# Patient Record
Sex: Female | Born: 1975 | Race: White | Hispanic: No | Marital: Married | Smoking: Never smoker
Health system: Southern US, Community
[De-identification: ages and names within clinical notes are randomized; demographics above are authoritative.]

## PROBLEM LIST (undated history)

## (undated) DIAGNOSIS — D069 Carcinoma in situ of cervix, unspecified: Secondary | ICD-10-CM

## (undated) DIAGNOSIS — O24419 Gestational diabetes mellitus in pregnancy, unspecified control: Secondary | ICD-10-CM

## (undated) DIAGNOSIS — K219 Gastro-esophageal reflux disease without esophagitis: Secondary | ICD-10-CM

## (undated) DIAGNOSIS — F419 Anxiety disorder, unspecified: Secondary | ICD-10-CM

## (undated) DIAGNOSIS — Z8619 Personal history of other infectious and parasitic diseases: Secondary | ICD-10-CM

## (undated) DIAGNOSIS — O09529 Supervision of elderly multigravida, unspecified trimester: Secondary | ICD-10-CM

## (undated) DIAGNOSIS — E0590 Thyrotoxicosis, unspecified without thyrotoxic crisis or storm: Secondary | ICD-10-CM

## (undated) DIAGNOSIS — R87619 Unspecified abnormal cytological findings in specimens from cervix uteri: Secondary | ICD-10-CM

## (undated) DIAGNOSIS — E039 Hypothyroidism, unspecified: Secondary | ICD-10-CM

## (undated) DIAGNOSIS — J45909 Unspecified asthma, uncomplicated: Secondary | ICD-10-CM

## (undated) DIAGNOSIS — Q51810 Arcuate uterus: Secondary | ICD-10-CM

## (undated) DIAGNOSIS — E0500 Thyrotoxicosis with diffuse goiter without thyrotoxic crisis or storm: Secondary | ICD-10-CM

## (undated) DIAGNOSIS — IMO0002 Reserved for concepts with insufficient information to code with codable children: Secondary | ICD-10-CM

## (undated) DIAGNOSIS — O09299 Supervision of pregnancy with other poor reproductive or obstetric history, unspecified trimester: Secondary | ICD-10-CM

## (undated) DIAGNOSIS — Z8632 Personal history of gestational diabetes: Secondary | ICD-10-CM

## (undated) HISTORY — DX: Personal history of gestational diabetes: Z86.32

## (undated) HISTORY — DX: Supervision of pregnancy with other poor reproductive or obstetric history, unspecified trimester: O09.299

## (undated) HISTORY — DX: Hypothyroidism, unspecified: E03.9

## (undated) HISTORY — DX: Anxiety disorder, unspecified: F41.9

## (undated) HISTORY — DX: Thyrotoxicosis, unspecified without thyrotoxic crisis or storm: E05.90

## (undated) HISTORY — DX: Thyrotoxicosis with diffuse goiter without thyrotoxic crisis or storm: E05.00

## (undated) HISTORY — DX: Gestational diabetes mellitus in pregnancy, unspecified control: O24.419

## (undated) HISTORY — DX: Unspecified asthma, uncomplicated: J45.909

## (undated) HISTORY — DX: Carcinoma in situ of cervix, unspecified: D06.9

## (undated) HISTORY — DX: Arcuate uterus: Q51.810

## (undated) HISTORY — DX: Personal history of other infectious and parasitic diseases: Z86.19

## (undated) HISTORY — PX: WISDOM TOOTH EXTRACTION: SHX21

## (undated) HISTORY — PX: BREAST BIOPSY: SHX20

## (undated) HISTORY — DX: Supervision of elderly multigravida, unspecified trimester: O09.529

---

## 1996-02-17 HISTORY — PX: CERVICAL BIOPSY  W/ LOOP ELECTRODE EXCISION: SUR135

## 1997-01-16 HISTORY — PX: LEEP: SHX91

## 1998-01-06 ENCOUNTER — Other Ambulatory Visit: Admission: RE | Admit: 1998-01-06 | Discharge: 1998-01-06 | Payer: Self-pay | Admitting: Obstetrics and Gynecology

## 2000-01-25 ENCOUNTER — Other Ambulatory Visit: Admission: RE | Admit: 2000-01-25 | Discharge: 2000-01-25 | Payer: Self-pay | Admitting: Obstetrics and Gynecology

## 2001-03-25 ENCOUNTER — Encounter: Admission: RE | Admit: 2001-03-25 | Discharge: 2001-03-25 | Payer: Self-pay | Admitting: Internal Medicine

## 2001-03-25 ENCOUNTER — Encounter: Payer: Self-pay | Admitting: Internal Medicine

## 2001-04-02 ENCOUNTER — Other Ambulatory Visit: Admission: RE | Admit: 2001-04-02 | Discharge: 2001-04-02 | Payer: Self-pay | Admitting: Obstetrics and Gynecology

## 2002-04-08 ENCOUNTER — Other Ambulatory Visit: Admission: RE | Admit: 2002-04-08 | Discharge: 2002-04-08 | Payer: Self-pay | Admitting: Obstetrics and Gynecology

## 2003-11-11 ENCOUNTER — Other Ambulatory Visit: Admission: RE | Admit: 2003-11-11 | Discharge: 2003-11-11 | Payer: Self-pay | Admitting: Obstetrics and Gynecology

## 2004-02-26 ENCOUNTER — Other Ambulatory Visit: Admission: RE | Admit: 2004-02-26 | Discharge: 2004-02-26 | Payer: Self-pay | Admitting: Gynecology

## 2004-03-13 ENCOUNTER — Inpatient Hospital Stay (HOSPITAL_COMMUNITY): Admission: AD | Admit: 2004-03-13 | Discharge: 2004-03-15 | Payer: Self-pay | Admitting: Gynecology

## 2004-05-17 ENCOUNTER — Encounter: Admission: RE | Admit: 2004-05-17 | Discharge: 2004-08-15 | Payer: Self-pay | Admitting: Gynecology

## 2004-09-12 ENCOUNTER — Inpatient Hospital Stay (HOSPITAL_COMMUNITY): Admission: AD | Admit: 2004-09-12 | Discharge: 2004-09-15 | Payer: Self-pay | Admitting: Gynecology

## 2004-09-13 ENCOUNTER — Encounter (INDEPENDENT_AMBULATORY_CARE_PROVIDER_SITE_OTHER): Payer: Self-pay | Admitting: Specialist

## 2004-12-02 ENCOUNTER — Other Ambulatory Visit: Admission: RE | Admit: 2004-12-02 | Discharge: 2004-12-02 | Payer: Self-pay | Admitting: Gynecology

## 2005-03-28 ENCOUNTER — Emergency Department (HOSPITAL_COMMUNITY): Admission: EM | Admit: 2005-03-28 | Discharge: 2005-03-28 | Payer: Self-pay | Admitting: *Deleted

## 2005-04-09 ENCOUNTER — Encounter: Admission: RE | Admit: 2005-04-09 | Discharge: 2005-04-09 | Payer: Self-pay | Admitting: Otolaryngology

## 2005-05-01 ENCOUNTER — Ambulatory Visit (HOSPITAL_COMMUNITY): Admission: RE | Admit: 2005-05-01 | Discharge: 2005-05-01 | Payer: Self-pay | Admitting: Gynecology

## 2005-07-26 ENCOUNTER — Other Ambulatory Visit: Admission: RE | Admit: 2005-07-26 | Discharge: 2005-07-26 | Payer: Self-pay | Admitting: Gynecology

## 2006-02-12 ENCOUNTER — Inpatient Hospital Stay (HOSPITAL_COMMUNITY): Admission: AD | Admit: 2006-02-12 | Discharge: 2006-02-14 | Payer: Self-pay | Admitting: Gynecology

## 2006-03-13 ENCOUNTER — Other Ambulatory Visit: Admission: RE | Admit: 2006-03-13 | Discharge: 2006-03-13 | Payer: Self-pay | Admitting: Gynecology

## 2007-04-12 IMAGING — CR DG CHEST 2V
2 series · 2 of 2 positions shown · non-contrast
Comparison: none

CLINICAL DATA: Swollen lymph nodes in the posterior cervical region with no history of smoking. 
 CHEST ? 2 VIEW:

[view not recorded (1 of 2)]
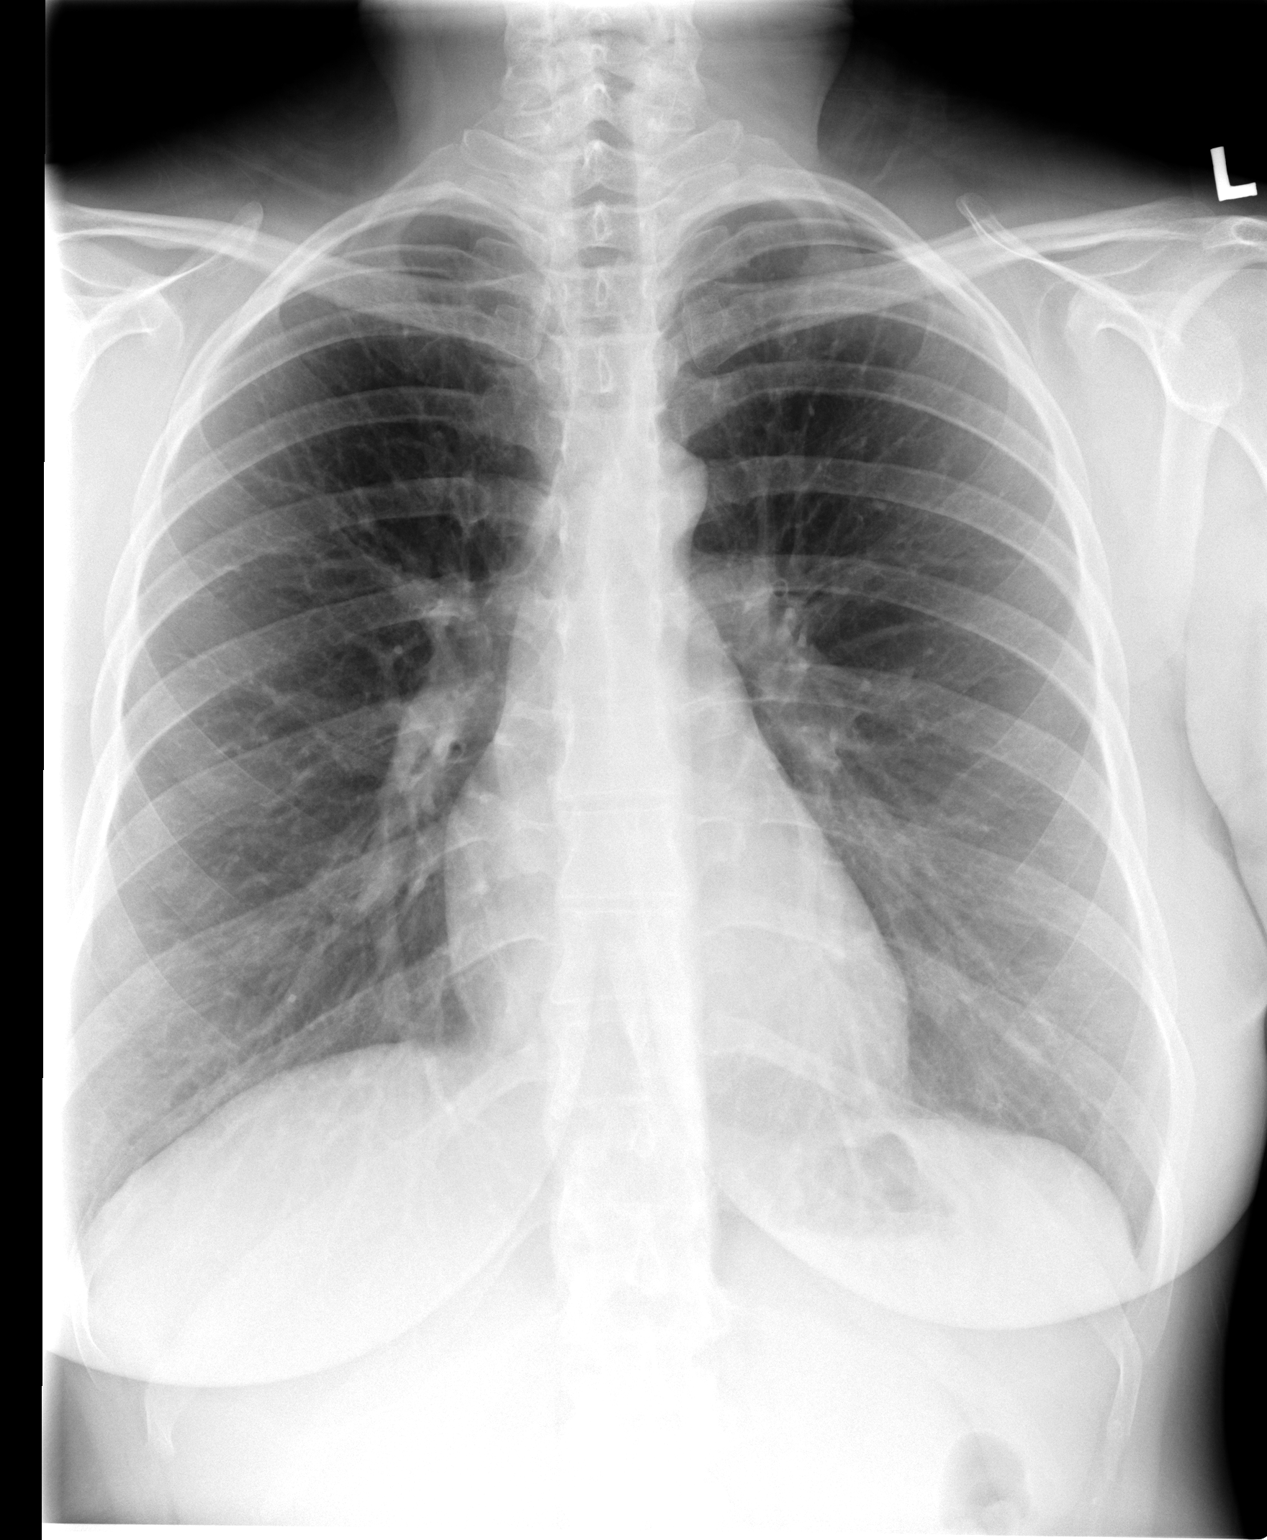

[view not recorded (2 of 2)]
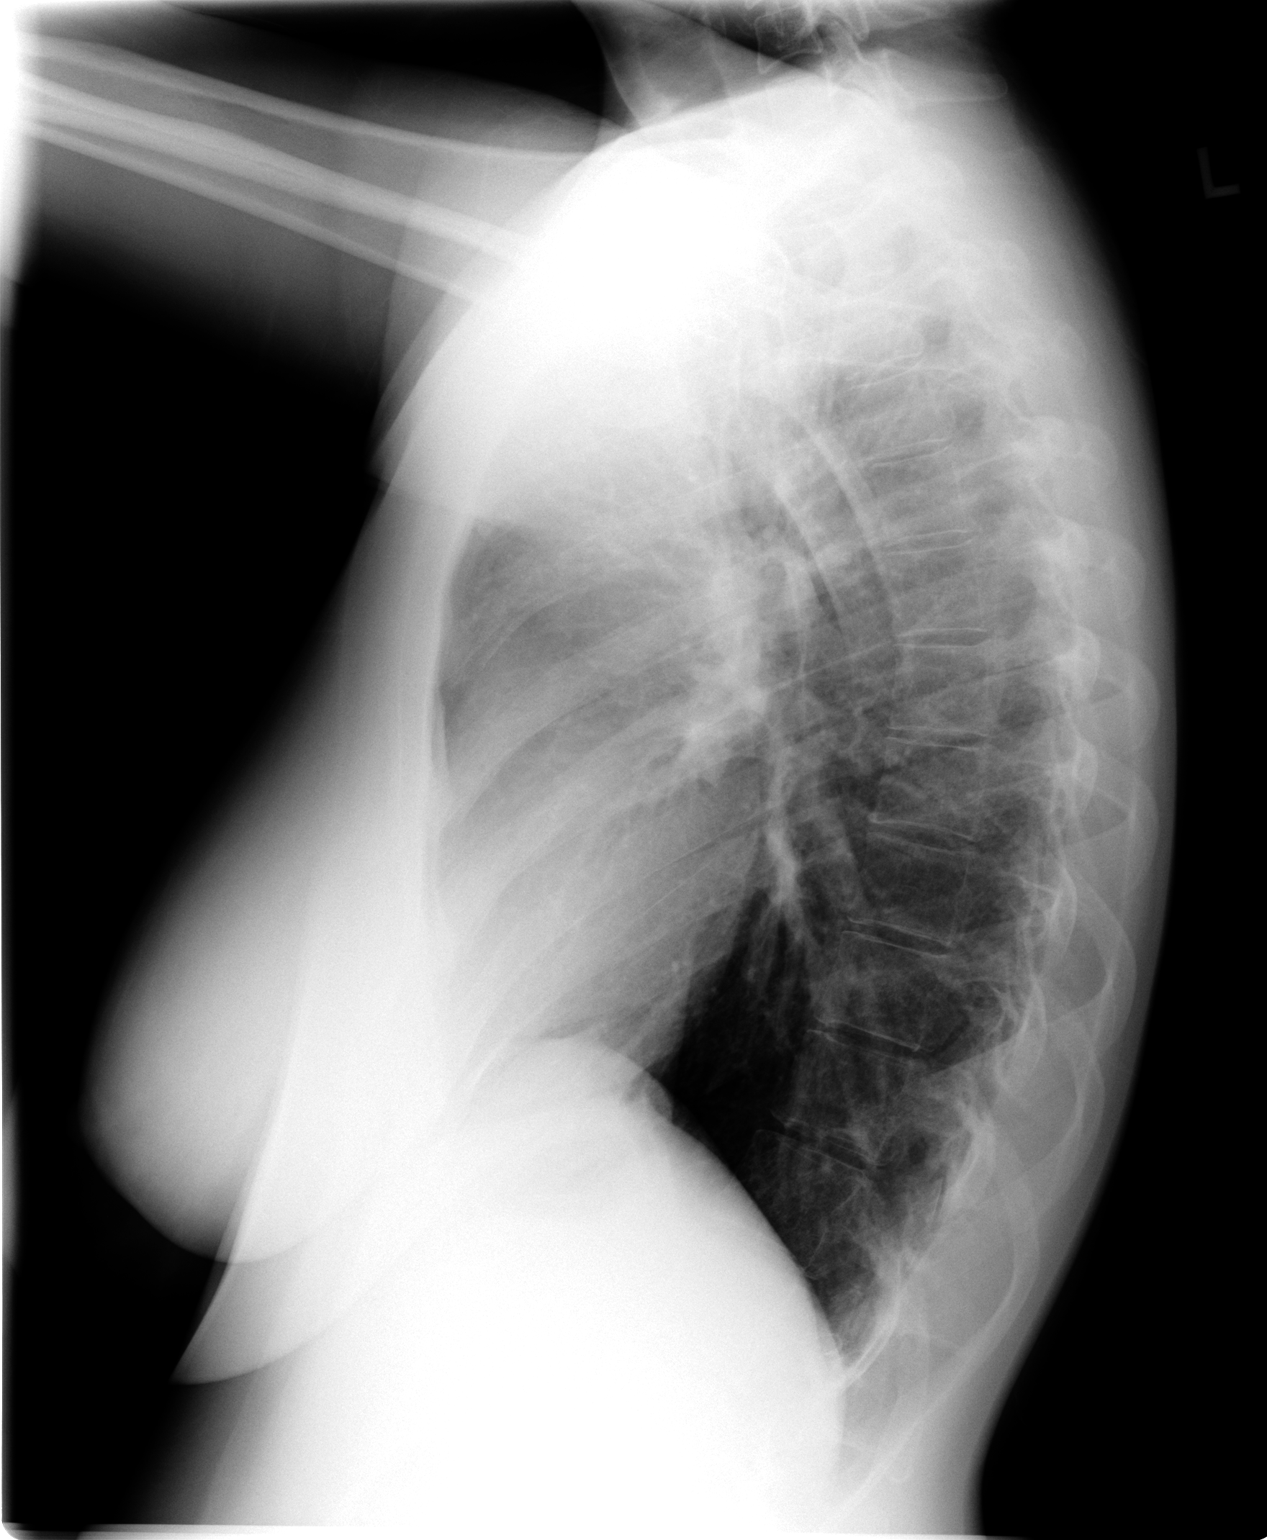

[2 of 2 positions shown; findings below may reference images not displayed]

FINDINGS: Heart and mediastinal contours are within normal limits.  The lung fields are clear with no evidence for focal infiltrate or congestive failure.  Bony structures appear intact.
IMPRESSION: No acute disease.

## 2007-04-12 IMAGING — MG MM DIGITAL SCREENING BILAT
4 series · 4 of 4 positions shown · non-contrast
Comparison: none

Bilateral CC and MLO view(s) were taken.

SCREENING MAMMOGRAM:
There is a  dense fibroglandular pattern.  No masses or malignant type calcifications are 
identified.

[R CC]
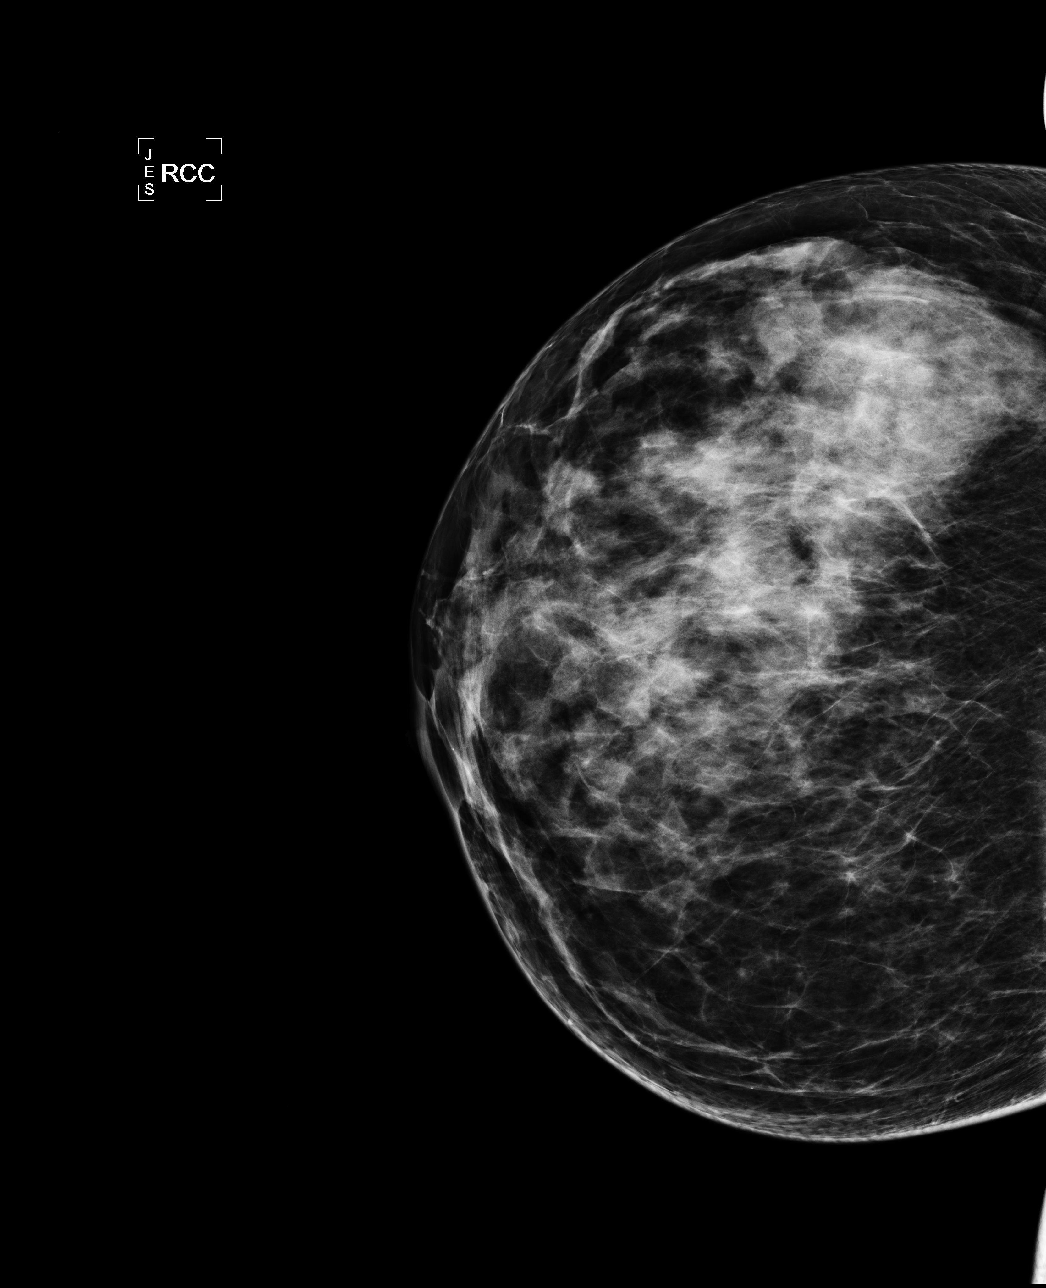

[R MLO]
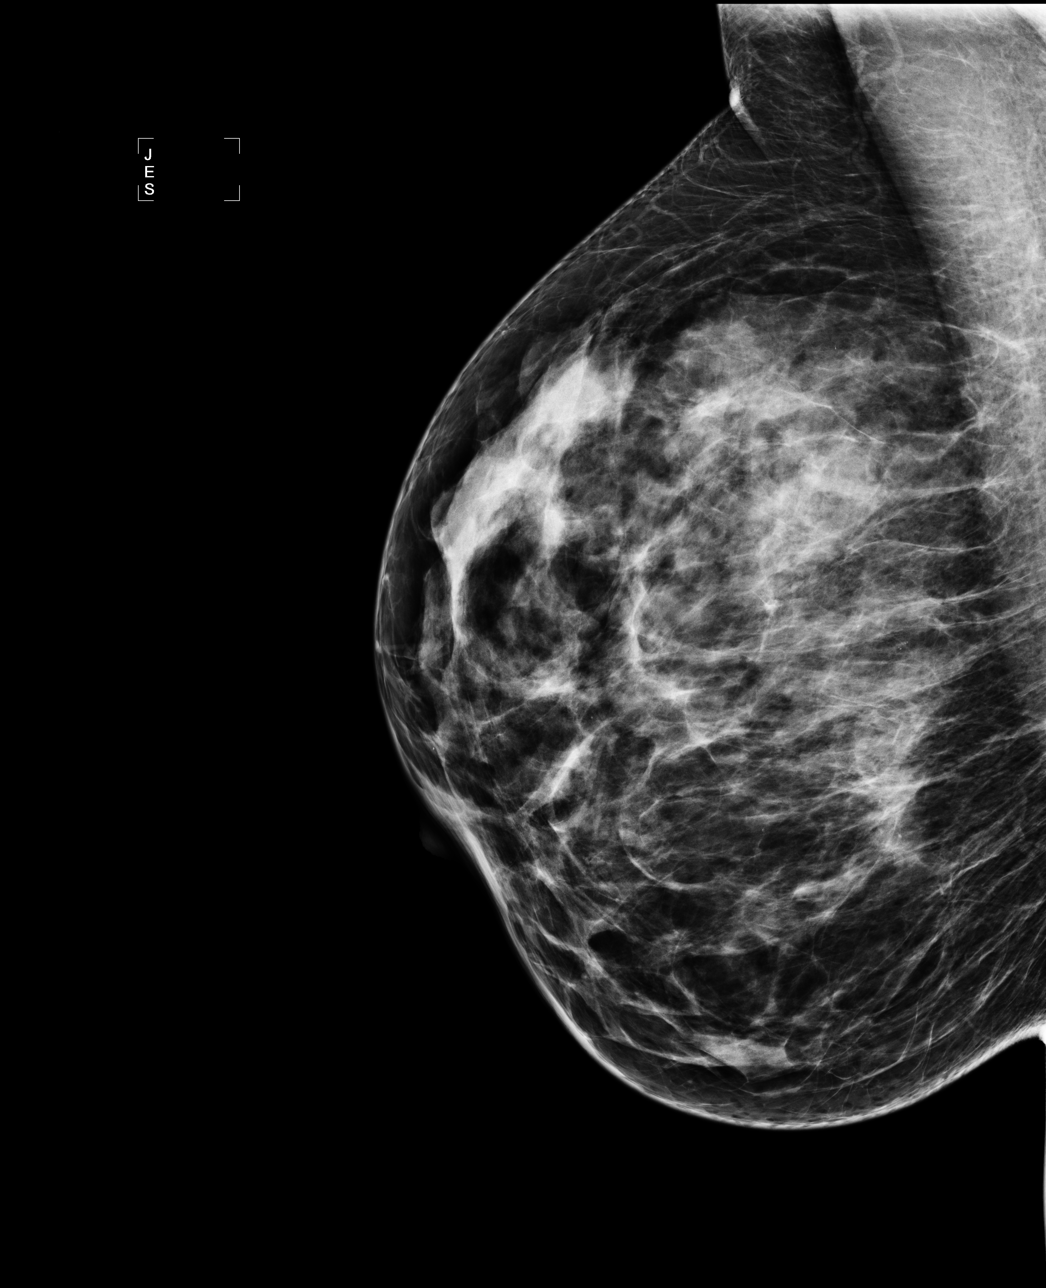

[L CC]
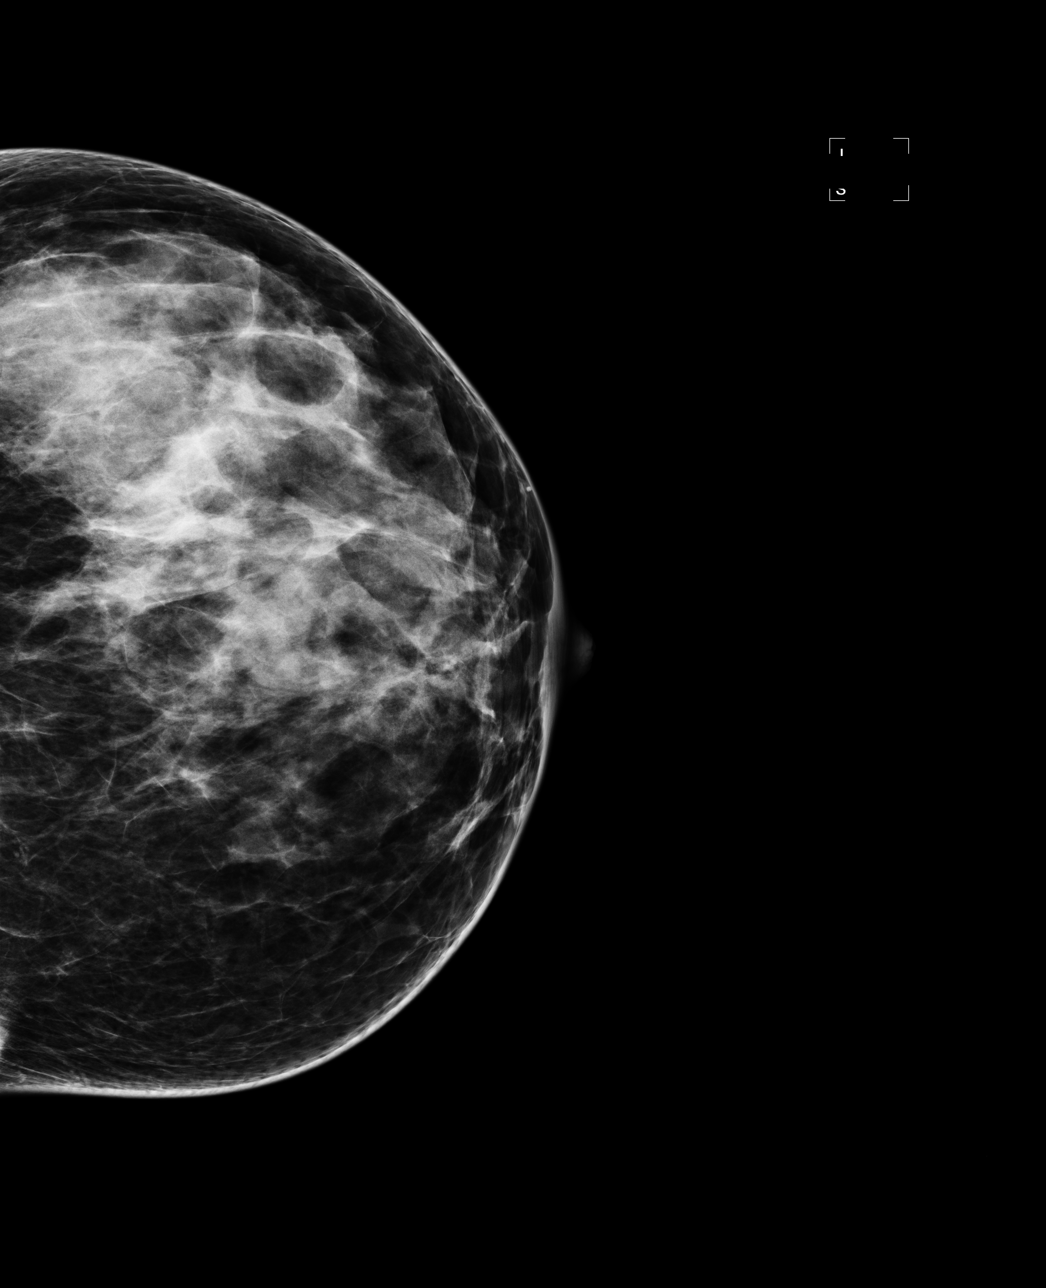

[L MLO]
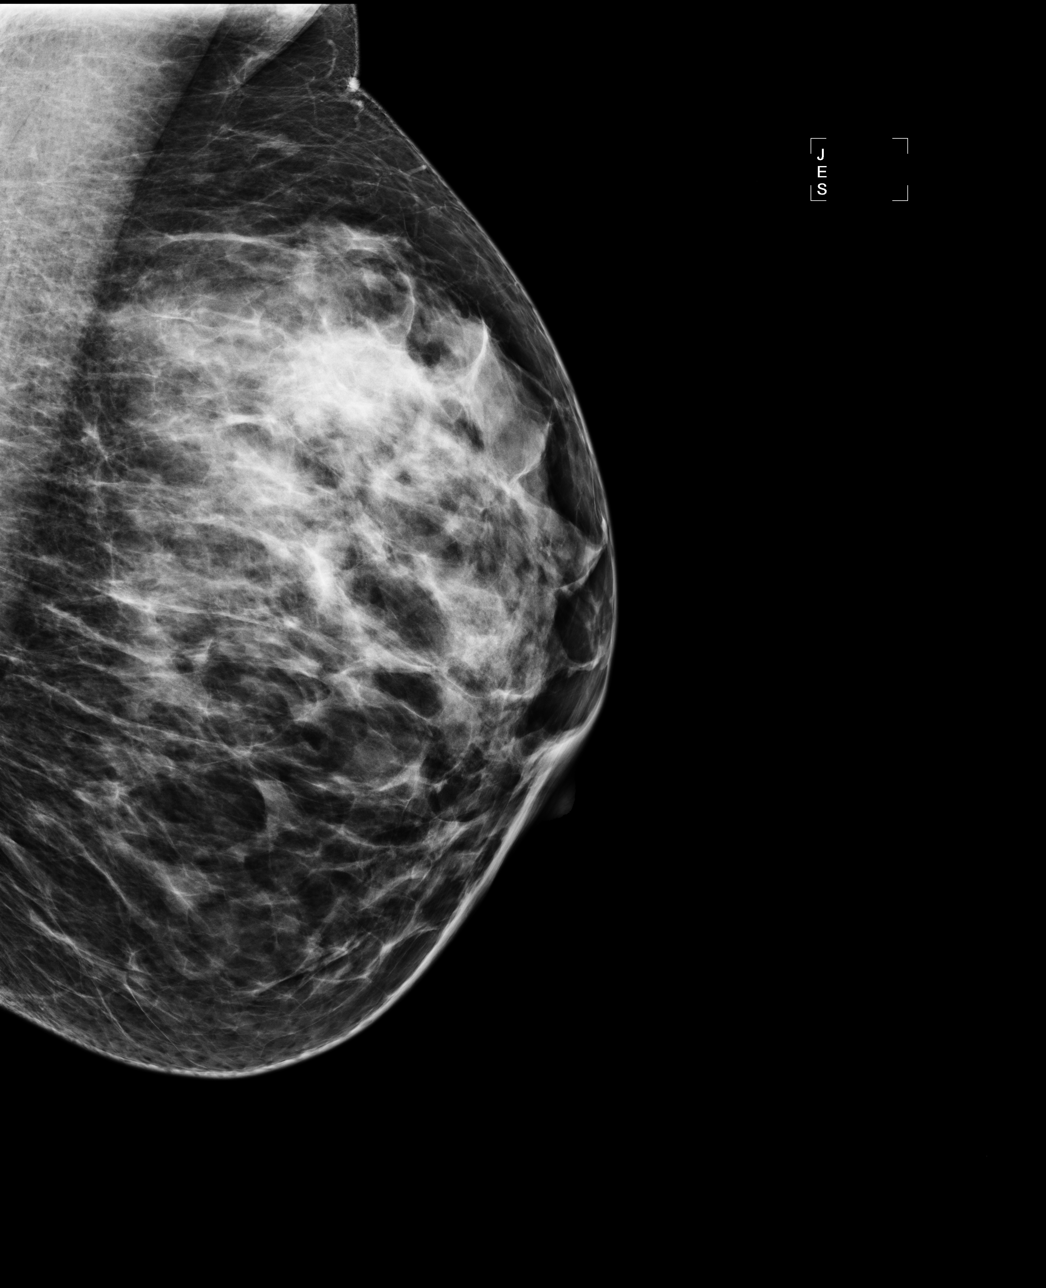

[4 of 4 positions shown; findings below may reference images not displayed]

IMPRESSION: No specific mammographic evidence of malignancy.  Next screening mammogram is recommended in one 
year.

ASSESSMENT: Negative - BI-RADS 1

Screening mammogram in 1 year.

## 2007-06-04 ENCOUNTER — Other Ambulatory Visit: Admission: RE | Admit: 2007-06-04 | Discharge: 2007-06-04 | Payer: Self-pay | Admitting: Gynecology

## 2007-07-15 ENCOUNTER — Encounter: Admission: RE | Admit: 2007-07-15 | Discharge: 2007-07-15 | Payer: Self-pay | Admitting: Family Medicine

## 2008-07-03 ENCOUNTER — Encounter: Payer: Self-pay | Admitting: Gynecology

## 2008-07-03 ENCOUNTER — Ambulatory Visit: Payer: Self-pay | Admitting: Gynecology

## 2008-07-03 ENCOUNTER — Other Ambulatory Visit: Admission: RE | Admit: 2008-07-03 | Discharge: 2008-07-03 | Payer: Self-pay | Admitting: Gynecology

## 2009-10-19 ENCOUNTER — Ambulatory Visit: Payer: Self-pay | Admitting: Gynecology

## 2009-10-26 ENCOUNTER — Ambulatory Visit: Payer: Self-pay | Admitting: Gynecology

## 2009-12-27 ENCOUNTER — Ambulatory Visit: Payer: Self-pay | Admitting: Gynecology

## 2009-12-27 ENCOUNTER — Other Ambulatory Visit
Admission: RE | Admit: 2009-12-27 | Discharge: 2009-12-27 | Payer: Self-pay | Source: Home / Self Care | Admitting: Gynecology

## 2010-01-03 ENCOUNTER — Ambulatory Visit: Payer: Self-pay | Admitting: Gynecology

## 2010-01-07 ENCOUNTER — Ambulatory Visit: Payer: Self-pay | Admitting: Gynecology

## 2010-01-11 ENCOUNTER — Ambulatory Visit
Admission: RE | Admit: 2010-01-11 | Discharge: 2010-01-11 | Payer: Self-pay | Source: Home / Self Care | Attending: Gynecology | Admitting: Gynecology

## 2010-01-26 ENCOUNTER — Ambulatory Visit
Admission: RE | Admit: 2010-01-26 | Discharge: 2010-01-26 | Payer: Self-pay | Source: Home / Self Care | Attending: Gynecology | Admitting: Gynecology

## 2010-01-31 ENCOUNTER — Ambulatory Visit: Admit: 2010-01-31 | Payer: Self-pay | Admitting: Gynecology

## 2010-03-24 ENCOUNTER — Other Ambulatory Visit: Payer: BC Managed Care – PPO

## 2010-03-24 ENCOUNTER — Ambulatory Visit (INDEPENDENT_AMBULATORY_CARE_PROVIDER_SITE_OTHER): Payer: BC Managed Care – PPO | Admitting: Gynecology

## 2010-03-24 DIAGNOSIS — N949 Unspecified condition associated with female genital organs and menstrual cycle: Secondary | ICD-10-CM

## 2010-03-24 DIAGNOSIS — E039 Hypothyroidism, unspecified: Secondary | ICD-10-CM

## 2010-06-03 NOTE — H&P (Signed)
NAME:  Tammy Walton, Tammy Walton            ACCOUNT NO.:  [id number]   MEDICAL RECORD NO.:  [id number]          PATIENT TYPE:  INP   LOCATION:                                FACILITY:  [facility]   PHYSICIAN:  Juan H. Lily Peer, M.D.     DATE OF BIRTH:   DATE OF ADMISSION:  02/16/2006  DATE OF DISCHARGE:                              HISTORY & PHYSICAL   The patient is scheduled to be induced in the hospital at [facility] on Friday, February 16, 2006.   CHIEF COMPLAINT:  The patient is a 35 year old gravida 2, para 1 with  last menstrual period May 16, 2005; estimated date of confinement  February 20, 2006.  The patient will be 39-and-a-half weeks gestation at  the time of admission to [facility].  The patient was seen in the  office today, January 25, and her pelvic examination demonstrated that  she was 2-3 cm dilated, 80% effaced, -2 station.  The patient with  history of Graves disease and subsequently has been on levothyroxine 150  mcg daily.  She has periodically had her thyroid function tests every  trimester and has been euthyroid.  She has had seasonal asthma.  She is  Rh negative and her husband is Rh negative.  The patient with  gestational diabetes during her pregnancy was diet controlled.  Last  ultrasound in the office done on January 18 demonstrated an estimated  fetal weight of 7 pounds 7 ounces in the 74th percentile and a normal  AFI at 20.9 consistent with the 87th percentile, and the fetus was in  the vertex presentation.  She has monitored her blood sugars and she has  been euglycemic throughout her pregnancy, has a favorable cervix, and  will be admitted on the morning of February 1 for high-dose Pitocin and  rupturing of membranes.  The patient had a normal first trimester screen  as well as alpha-fetoprotein screening, and the remainder of her labs  with the exception of her blood sugar were all normal.   PHYSICAL EXAMINATION:  VITAL SIGNS:  The patient weighs 185  pounds.  Blood pressure 120/80.  Urine was negative for protein and glucose.  HEENT:  Unremarkable.  NECK:  Supple, trachea midline.  No carotid bruits, no thyromegaly.  LUNGS:  Clear to auscultation without rhonchi or wheezes.  HEART:  Regular rate and rhythm.  No murmurs or gallops.  BREAST:  Exam not done.  ABDOMEN:  Gravid uterus, vertex presentation by [employer].  Fundal height 38 cm.  PELVIC:  Cervix 2-3 cm, 80% effaced, -2 station.  EXTREMITIES:  DTR 1+, negative clonus.   PRENATAL LABORATORY DATA:  A negative blood type, negative antibody  screen.  VDRL was nonreactive.  Rubella immune.  Hepatitis B surface  antigen and HIV were negative.  Alpha-fetoprotein was negative.  GBS  culture was positive; the patient will need to be placed on prophylactic  antibiotics.  The patient had abnormal diabetes screen.   ASSESSMENT:  A 35 year old gravida 2, para 1 with history of gestational  diabetes, will be 39-and-a-half weeks gestation at the time that  she is  admitted to the hospital for high-dose Pitocin and rupturing of  membranes.  The patient with positive history of GBS.  She will placed  on penicillin G 5,000,000 units loading dose followed by  2,500,000 units IV q.4h. and will anticipate a normal vaginal delivery.  The patient on Levothroid 150 mcg daily and she takes albuterol inhaler  p.r.n. for her asthma.   PLAN:  As per assessment above.      Juan H. Lily Peer, M.D.  Electronically Signed     JHF/MEDQ  D:  02/09/2006  T:  02/09/2006  Job:  [id number]

## 2010-06-03 NOTE — H&P (Signed)
NAME:  Tammy Walton, Tammy Walton            ACCOUNT NO.:  [id number]   MEDICAL RECORD NO.:  [id number]           PATIENT TYPE:   LOCATION:                                 FACILITY:   PHYSICIAN:  Juan H. Lily Peer, M.D.DATE OF BIRTH:  05/03/1975   DATE OF ADMISSION:  09/12/2004  DATE OF DISCHARGE:                                HISTORY & PHYSICAL   CHIEF COMPLAINT:  1.  Term intrauterine pregnancy.  2.  Elective induction.   HISTORY:  The patient is a 35 year old, gravida , para 0, at 89 weeks  estimated gestational age, is being admitted for an elective induction.  The  patient was seen in the office on August 25. Her cervix was 1  cardiomyopathy, 80% effaced, -3 station.  The patient with history of  Grave's disease and subsequently had been on Levothroid 150 mcg daily.  She  has been followed with trimester TSH which have been in the normal range.  She has also had third trimester antepartum testing consisting of NSTs which  has been fine and also patient has had normal ultrasound screen and has had  appropriate growth.  Her last ultrasound had been done at 52 weeks'  gestation, and the fetus is in the 92nd percentile at that point, but fundal  height measurements have been in accordance with her dates up to this point.  The patient's group B strep status was negative.  Both her and her husband  are Rh negative, so she did not receive RhoGAM.   PAST MEDICAL HISTORY:  1.  History of hypothyroidism, on Levothroid 150 mg daily.  2.  History of asthma for which she takes albuterol inhaler p.r.n.  3.  The patient denies any other medical problems with the exception that      she had a LEEP conization of her cervix in the past.  4.  Her Pap smear in February of this year was reported to be normal.   REVIEW OF SYSTEMS:  See [place] form.   PHYSICAL EXAMINATION:  VITAL SIGNS:  Blood pressure 118/74.  Urine was  negative for protein and sugar.  Weight 185 pounds.  HEENT:  Unremarkable.  NECK:  Supple.  Trachea midline.  No carotid bruits.  No thyromegaly.  LUNGS:  Clear to auscultation without rhonchi or wheezes.  HEART:  Regular rate and rhythm.  No murmurs or gallops.  BREAST:  Exam not done.  ABDOMEN:  Gravid uterus, vertex presentation by [employer].  PELVIC EXAM:  Cervix 1 cm, 80% effaced, -3 to -2 station.  EXTREMITIES:  DTR 1+, negative clonus.   PRENATAL LABORATORY:  A negative blood type, negative antibody screen, VDRL  was nonreactive, rubella immune, hepatitis B surface antigen and HIV were  negative.  The patient had an abnormal diabetes screen, abnormal normal 3  hour GTT, and she has been followed as a gestational diabetic with good  blood sugar control.  Her alpha-fetoprotein was normal, and her group B  strep culture was negative.   ASSESSMENT:  A 35year-old gravida 1, para 0, at 39 weeks estimated  gestational with history of gestational diabetes, diet  controlled, normal  antepartum testing consisting of reassuring fetal heart rate tracing.  The  patient with history of Grave's disease in the past, subsequently developed  hypothyroidism, has been q thyroid on Levothroid 150 mcg daily.  The patient  GBS negative.  The patient is being admitted for elective induction  consisting of Cervidil for cervical ripening followed by Pitocin IV 12 hours  after Cervidil.  The risks, benefits, pros and cons of elective induction  discussed with the patient, and we will follow accordingly.   PLAN:  As per assessment above.      Juan H. Lily Peer, M.D.  Electronically Signed     JHF/MEDQ  D:  09/12/2004  T:  09/12/2004  Job:  [id number]

## 2010-07-14 ENCOUNTER — Other Ambulatory Visit (INDEPENDENT_AMBULATORY_CARE_PROVIDER_SITE_OTHER): Payer: BC Managed Care – PPO

## 2010-07-14 DIAGNOSIS — N912 Amenorrhea, unspecified: Secondary | ICD-10-CM

## 2010-07-18 ENCOUNTER — Other Ambulatory Visit: Payer: BC Managed Care – PPO

## 2010-07-19 ENCOUNTER — Other Ambulatory Visit (INDEPENDENT_AMBULATORY_CARE_PROVIDER_SITE_OTHER): Payer: BC Managed Care – PPO

## 2010-07-19 DIAGNOSIS — N912 Amenorrhea, unspecified: Secondary | ICD-10-CM

## 2010-07-27 ENCOUNTER — Ambulatory Visit (INDEPENDENT_AMBULATORY_CARE_PROVIDER_SITE_OTHER): Payer: BC Managed Care – PPO | Admitting: Gynecology

## 2010-07-27 ENCOUNTER — Other Ambulatory Visit: Payer: BC Managed Care – PPO

## 2010-07-27 DIAGNOSIS — N912 Amenorrhea, unspecified: Secondary | ICD-10-CM

## 2010-07-27 DIAGNOSIS — O9989 Other specified diseases and conditions complicating pregnancy, childbirth and the puerperium: Secondary | ICD-10-CM

## 2010-07-27 DIAGNOSIS — O09529 Supervision of elderly multigravida, unspecified trimester: Secondary | ICD-10-CM

## 2010-08-18 ENCOUNTER — Ambulatory Visit: Payer: Self-pay

## 2010-08-18 ENCOUNTER — Ambulatory Visit (INDEPENDENT_AMBULATORY_CARE_PROVIDER_SITE_OTHER): Payer: BC Managed Care – PPO | Admitting: Gynecology

## 2010-08-18 ENCOUNTER — Other Ambulatory Visit: Payer: BC Managed Care – PPO

## 2010-08-18 ENCOUNTER — Encounter: Payer: Self-pay | Admitting: Gynecology

## 2010-08-18 DIAGNOSIS — N912 Amenorrhea, unspecified: Secondary | ICD-10-CM

## 2010-08-18 DIAGNOSIS — O9989 Other specified diseases and conditions complicating pregnancy, childbirth and the puerperium: Secondary | ICD-10-CM

## 2010-08-18 DIAGNOSIS — Z3490 Encounter for supervision of normal pregnancy, unspecified, unspecified trimester: Secondary | ICD-10-CM

## 2010-08-18 DIAGNOSIS — O09529 Supervision of elderly multigravida, unspecified trimester: Secondary | ICD-10-CM

## 2010-08-18 LAB — US OB TRANSVAGINAL

## 2010-08-18 NOTE — Progress Notes (Signed)
Patient is a 35 year old gravida 4 para 2 AB 2 with past history of hyperthyroidism treated with iodine-131. Subsequently developed hypothyroidism and has been under the care of Dr. Lurene Shadow who has been monitoring her thyroid function. She is currently on levothyroxine and split dosage consisting of 150 mcg for 2 days followed by 175 mcg for 5 days. As a result of her thyroid dysfunction in the past she has had 2 miscarriages. Now that she is euthyroid she has been able to keep this pregnancy for which the ultrasound today demonstrated a viable intrauterine pregnancy at 9 weeks and 1 day consistent with her last menstrual period with a due date of 03/22/2011. Fetal pole and cardiac activity was noted ultrasound today the cervix long and closed in a right corpus luteum cyst was evident. She has been on baby aspirin 81 mg daily and has also been on progesterone suppository 25 mg daily for luteal support for the first 12 weeks of her pregnancy. She is also currently on her prenatal vitamins and is otherwise doing well without any symptoms today. Her last Pap smear in our office was in December 2011 she has had a history of CIN-3 whereby she had a LEEP cervical conization February of 98. Both her and her husband are both Rh-. As she has had gestational diabetes with her previous pregnancy. Both pregnancies were delivered at term and were both vaginal deliveries. She [facility] and will form a copy of this note from today. We have discussed today about the first trimester screen as well as genetic amniocentesis which my colleague will discuss with her at that visit which I recommended she make it within the next one to 2 weeks.

## 2010-08-25 ENCOUNTER — Telehealth: Payer: Self-pay | Admitting: *Deleted

## 2010-08-25 NOTE — Telephone Encounter (Signed)
PT CALLED STATING SHE NEEDS RECORDS FAXED TO [facility]. PT INFORMED TO FILL OUT MEDICAL RELEASE FORM.

## 2010-08-26 LAB — RPR: RPR: NONREACTIVE

## 2010-08-26 LAB — ANTIBODY SCREEN: Antibody Screen: NEGATIVE

## 2010-08-26 LAB — GC/CHLAMYDIA PROBE AMP, GENITAL
Chlamydia: NEGATIVE
Gonorrhea: NEGATIVE

## 2010-08-26 LAB — ABO/RH: RH Type: NEGATIVE

## 2010-08-26 LAB — HIV ANTIBODY (ROUTINE TESTING W REFLEX): HIV: NONREACTIVE

## 2010-08-26 LAB — HEPATITIS B SURFACE ANTIGEN: Hepatitis B Surface Ag: NEGATIVE

## 2010-08-26 LAB — RUBELLA ANTIBODY, IGM: Rubella: IMMUNE

## 2010-10-05 ENCOUNTER — Encounter: Payer: Self-pay | Admitting: Gynecology

## 2011-02-16 LAB — STREP B DNA PROBE: GBS: NEGATIVE

## 2011-03-13 ENCOUNTER — Encounter (HOSPITAL_COMMUNITY): Payer: Self-pay | Admitting: *Deleted

## 2011-03-13 ENCOUNTER — Telehealth (HOSPITAL_COMMUNITY): Payer: Self-pay | Admitting: *Deleted

## 2011-03-13 NOTE — Telephone Encounter (Signed)
Preadmission screen  

## 2011-03-20 ENCOUNTER — Inpatient Hospital Stay (HOSPITAL_COMMUNITY): Admission: RE | Admit: 2011-03-20 | Payer: BC Managed Care – PPO | Source: Ambulatory Visit

## 2011-03-22 ENCOUNTER — Encounter (HOSPITAL_COMMUNITY): Payer: Self-pay | Admitting: Anesthesiology

## 2011-03-22 ENCOUNTER — Encounter (HOSPITAL_COMMUNITY): Payer: Self-pay

## 2011-03-22 ENCOUNTER — Inpatient Hospital Stay (HOSPITAL_COMMUNITY)
Admission: RE | Admit: 2011-03-22 | Discharge: 2011-03-24 | DRG: 373 | Disposition: A | Discharge status: Home / Self Care | Payer: BC Managed Care – PPO | Source: Ambulatory Visit | Attending: Obstetrics and Gynecology | Admitting: Obstetrics and Gynecology

## 2011-03-22 ENCOUNTER — Inpatient Hospital Stay (HOSPITAL_COMMUNITY): Payer: BC Managed Care – PPO | Admitting: Anesthesiology

## 2011-03-22 DIAGNOSIS — O09529 Supervision of elderly multigravida, unspecified trimester: Secondary | ICD-10-CM | POA: Diagnosis present

## 2011-03-22 DIAGNOSIS — IMO0002 Reserved for concepts with insufficient information to code with codable children: Secondary | ICD-10-CM | POA: Diagnosis present

## 2011-03-22 DIAGNOSIS — E079 Disorder of thyroid, unspecified: Secondary | ICD-10-CM | POA: Diagnosis present

## 2011-03-22 DIAGNOSIS — Z3490 Encounter for supervision of normal pregnancy, unspecified, unspecified trimester: Secondary | ICD-10-CM

## 2011-03-22 DIAGNOSIS — E039 Hypothyroidism, unspecified: Secondary | ICD-10-CM | POA: Diagnosis present

## 2011-03-22 DIAGNOSIS — D069 Carcinoma in situ of cervix, unspecified: Secondary | ICD-10-CM | POA: Diagnosis present

## 2011-03-22 HISTORY — DX: Unspecified abnormal cytological findings in specimens from cervix uteri: R87.619

## 2011-03-22 HISTORY — DX: Reserved for concepts with insufficient information to code with codable children: IMO0002

## 2011-03-22 HISTORY — DX: Gastro-esophageal reflux disease without esophagitis: K21.9

## 2011-03-22 LAB — CBC
HCT: 32.7 % — ABNORMAL LOW (ref 36.0–46.0)
Hemoglobin: 11 g/dL — ABNORMAL LOW (ref 12.0–15.0)
MCH: 30.3 pg (ref 26.0–34.0)
MCHC: 33.6 g/dL (ref 30.0–36.0)
MCV: 90.1 fL (ref 78.0–100.0)
Platelets: 169 10*3/uL (ref 150–400)
RBC: 3.63 MIL/uL — ABNORMAL LOW (ref 3.87–5.11)
RDW: 13.2 % (ref 11.5–15.5)
WBC: 5.4 10*3/uL (ref 4.0–10.5)

## 2011-03-22 MED ORDER — BISACODYL 10 MG RE SUPP: 10.0000 mg | Freq: Every day | RECTAL | Status: DC | PRN

## 2011-03-22 MED ORDER — OXYTOCIN 20 UNITS IN LACTATED RINGERS INFUSION - SIMPLE
INTRAVENOUS | Status: AC
  Filled 2011-03-22: qty 1000

## 2011-03-22 MED ORDER — WITCH HAZEL-GLYCERIN EX PADS: 1.0000 "application " | MEDICATED_PAD | CUTANEOUS | Status: DC | PRN

## 2011-03-22 MED ORDER — LEVOTHYROXINE SODIUM 150 MCG PO TABS
150.0000 ug | ORAL_TABLET | Freq: Every day | ORAL | Status: DC
  Administered 2011-03-23 – 2011-03-24 (×2): 150 ug via ORAL
  Filled 2011-03-22 (×2): qty 1

## 2011-03-22 MED ORDER — LIDOCAINE HCL (PF) 1 % IJ SOLN
30.0000 mL | INTRAMUSCULAR | Status: DC | PRN
  Administered 2011-03-22: 30 mL via SUBCUTANEOUS
  Filled 2011-03-22: qty 30

## 2011-03-22 MED ORDER — TETANUS-DIPHTH-ACELL PERTUSSIS 5-2.5-18.5 LF-MCG/0.5 IM SUSP: 0.5000 mL | Freq: Once | INTRAMUSCULAR | Status: DC

## 2011-03-22 MED ORDER — EPHEDRINE 5 MG/ML INJ
10.0000 mg | INTRAVENOUS | Status: DC | PRN
  Filled 2011-03-22: qty 4

## 2011-03-22 MED ORDER — ALBUTEROL SULFATE HFA 108 (90 BASE) MCG/ACT IN AERS
2.0000 | INHALATION_SPRAY | Freq: Four times a day (QID) | RESPIRATORY_TRACT | Status: DC | PRN
  Filled 2011-03-22: qty 6.7

## 2011-03-22 MED ORDER — BENZOCAINE-MENTHOL 20-0.5 % EX AERO
INHALATION_SPRAY | CUTANEOUS | Status: AC
  Administered 2011-03-23: 1 via TOPICAL
  Filled 2011-03-22: qty 56

## 2011-03-22 MED ORDER — LIDOCAINE HCL (PF) 1 % IJ SOLN
INTRAMUSCULAR | Status: DC | PRN
  Administered 2011-03-22 (×11): 4 mL

## 2011-03-22 MED ORDER — FENTANYL CITRATE 0.05 MG/ML IJ SOLN: 100.0000 ug | Freq: Once | INTRAMUSCULAR | Status: DC

## 2011-03-22 MED ORDER — OXYCODONE-ACETAMINOPHEN 5-325 MG PO TABS
1.0000 | ORAL_TABLET | ORAL | Status: DC | PRN
  Administered 2011-03-22 – 2011-03-23 (×5): 1 via ORAL
  Filled 2011-03-22 (×5): qty 1

## 2011-03-22 MED ORDER — FENTANYL 2.5 MCG/ML BUPIVACAINE 1/10 % EPIDURAL INFUSION (WH - ANES)
14.0000 mL/h | INTRAMUSCULAR | Status: DC
  Administered 2011-03-22 (×2): 14 mL/h via EPIDURAL
  Filled 2011-03-22 (×2): qty 60

## 2011-03-22 MED ORDER — ACETAMINOPHEN 325 MG PO TABS: 650.0000 mg | ORAL_TABLET | ORAL | Status: DC | PRN

## 2011-03-22 MED ORDER — FLEET ENEMA 7-19 GM/118ML RE ENEM: 1.0000 | ENEMA | Freq: Every day | RECTAL | Status: DC | PRN

## 2011-03-22 MED ORDER — ONDANSETRON HCL 4 MG PO TABS: 4.0000 mg | ORAL_TABLET | ORAL | Status: DC | PRN

## 2011-03-22 MED ORDER — BUPIVACAINE HCL (PF) 0.25 % IJ SOLN
INTRAMUSCULAR | Status: DC | PRN
  Administered 2011-03-22 (×2): 5 mL via EPIDURAL

## 2011-03-22 MED ORDER — OXYTOCIN 20 UNITS IN LACTATED RINGERS INFUSION - SIMPLE
125.0000 mL/h | Freq: Once | INTRAVENOUS | Status: AC
  Administered 2011-03-22: 999 mL/h via INTRAVENOUS

## 2011-03-22 MED ORDER — DIPHENHYDRAMINE HCL 25 MG PO CAPS: 25.0000 mg | ORAL_CAPSULE | Freq: Four times a day (QID) | ORAL | Status: DC | PRN

## 2011-03-22 MED ORDER — EPHEDRINE 5 MG/ML INJ: 10.0000 mg | INTRAVENOUS | Status: DC | PRN

## 2011-03-22 MED ORDER — IBUPROFEN 600 MG PO TABS
600.0000 mg | ORAL_TABLET | Freq: Four times a day (QID) | ORAL | Status: DC
  Administered 2011-03-22 – 2011-03-24 (×7): 600 mg via ORAL
  Filled 2011-03-22 (×7): qty 1

## 2011-03-22 MED ORDER — LANOLIN HYDROUS EX OINT: TOPICAL_OINTMENT | CUTANEOUS | Status: DC | PRN

## 2011-03-22 MED ORDER — BENZOCAINE-MENTHOL 20-0.5 % EX AERO
1.0000 "application " | INHALATION_SPRAY | CUTANEOUS | Status: DC | PRN
  Administered 2011-03-23: 1 via TOPICAL

## 2011-03-22 MED ORDER — DIPHENHYDRAMINE HCL 50 MG/ML IJ SOLN: 12.5000 mg | INTRAMUSCULAR | Status: DC | PRN

## 2011-03-22 MED ORDER — ZOLPIDEM TARTRATE 5 MG PO TABS: 5.0000 mg | ORAL_TABLET | Freq: Every evening | ORAL | Status: DC | PRN

## 2011-03-22 MED ORDER — PHENYLEPHRINE 40 MCG/ML (10ML) SYRINGE FOR IV PUSH (FOR BLOOD PRESSURE SUPPORT)
80.0000 ug | PREFILLED_SYRINGE | INTRAVENOUS | Status: DC | PRN
  Filled 2011-03-22: qty 5

## 2011-03-22 MED ORDER — PHENYLEPHRINE 40 MCG/ML (10ML) SYRINGE FOR IV PUSH (FOR BLOOD PRESSURE SUPPORT): 80.0000 ug | PREFILLED_SYRINGE | INTRAVENOUS | Status: DC | PRN

## 2011-03-22 MED ORDER — PRENATAL MULTIVITAMIN CH
1.0000 | ORAL_TABLET | Freq: Every day | ORAL | Status: DC
  Administered 2011-03-23: 1 via ORAL
  Filled 2011-03-22 (×2): qty 1

## 2011-03-22 MED ORDER — OXYCODONE-ACETAMINOPHEN 5-325 MG PO TABS: 1.0000 | ORAL_TABLET | ORAL | Status: DC | PRN

## 2011-03-22 MED ORDER — TERBUTALINE SULFATE 1 MG/ML IJ SOLN: 0.2500 mg | Freq: Once | INTRAMUSCULAR | Status: DC | PRN

## 2011-03-22 MED ORDER — FENTANYL CITRATE 0.05 MG/ML IJ SOLN
INTRAMUSCULAR | Status: DC | PRN
  Administered 2011-03-22: 100 ug via EPIDURAL

## 2011-03-22 MED ORDER — OXYTOCIN BOLUS FROM INFUSION
500.0000 mL | Freq: Once | INTRAVENOUS | Status: DC
  Filled 2011-03-22: qty 500

## 2011-03-22 MED ORDER — DIBUCAINE 1 % RE OINT: 1.0000 "application " | TOPICAL_OINTMENT | RECTAL | Status: DC | PRN

## 2011-03-22 MED ORDER — OXYTOCIN 20 UNITS IN LACTATED RINGERS INFUSION - SIMPLE
1.0000 m[IU]/min | INTRAVENOUS | Status: DC
  Administered 2011-03-22: 2 m[IU]/min via INTRAVENOUS

## 2011-03-22 MED ORDER — LACTATED RINGERS IV SOLN
500.0000 mL | INTRAVENOUS | Status: DC | PRN
  Administered 2011-03-22: 1000 mL via INTRAVENOUS

## 2011-03-22 MED ORDER — FENTANYL CITRATE 0.05 MG/ML IJ SOLN
INTRAMUSCULAR | Status: AC
  Filled 2011-03-22: qty 2

## 2011-03-22 MED ORDER — LACTATED RINGERS IV SOLN
INTRAVENOUS | Status: DC
  Administered 2011-03-22 (×2): via INTRAVENOUS

## 2011-03-22 MED ORDER — ONDANSETRON HCL 4 MG/2ML IJ SOLN: 4.0000 mg | Freq: Four times a day (QID) | INTRAMUSCULAR | Status: DC | PRN

## 2011-03-22 MED ORDER — ONDANSETRON HCL 4 MG/2ML IJ SOLN: 4.0000 mg | INTRAMUSCULAR | Status: DC | PRN

## 2011-03-22 MED ORDER — LACTATED RINGERS IV SOLN
500.0000 mL | Freq: Once | INTRAVENOUS | Status: AC
  Administered 2011-03-22: 1000 mL via INTRAVENOUS

## 2011-03-22 MED ORDER — SIMETHICONE 80 MG PO CHEW: 80.0000 mg | CHEWABLE_TABLET | ORAL | Status: DC | PRN

## 2011-03-22 MED ORDER — FLEET ENEMA 7-19 GM/118ML RE ENEM: 1.0000 | ENEMA | RECTAL | Status: DC | PRN

## 2011-03-22 MED ORDER — CITRIC ACID-SODIUM CITRATE 334-500 MG/5ML PO SOLN: 30.0000 mL | ORAL | Status: DC | PRN

## 2011-03-22 MED ORDER — IBUPROFEN 600 MG PO TABS
600.0000 mg | ORAL_TABLET | Freq: Four times a day (QID) | ORAL | Status: DC | PRN
  Filled 2011-03-22: qty 1

## 2011-03-22 MED ORDER — SENNOSIDES-DOCUSATE SODIUM 8.6-50 MG PO TABS
2.0000 | ORAL_TABLET | Freq: Every day | ORAL | Status: DC
  Administered 2011-03-22 – 2011-03-23 (×2): 2 via ORAL

## 2011-03-22 NOTE — Progress Notes (Signed)
Delivery Note Steady progress in second stage, reactive FHT Tight perineum, second degree MLE done SVD VMI  Apgars 8/9, art pH  7.25, wt 8#  .08oz Placenta 3 vessels, intact Cord blood collected for study per pt request EBL 500cc Cx/vagina intact Second degree MLE repaired Pt/infant stable in LDR

## 2011-03-22 NOTE — Progress Notes (Signed)
Dr Rodman Pickle to evaluate epidural.  POC to replace epidural.  Pt positioned at side of bed.

## 2011-03-22 NOTE — Progress Notes (Signed)
Cx 3-4/80/-2 FHT reactive UCs q 3 min IUPC placed Pit on  Epidural in

## 2011-03-22 NOTE — Anesthesia Preprocedure Evaluation (Signed)
Anesthesia Evaluation  Patient identified by MRN, date of birth, ID band Patient awake    Reviewed: Allergy & Precautions, H&P , NPO status , Patient's Chart, lab work & pertinent test results, reviewed documented beta blocker date and time   History of Anesthesia Complications Negative for: history of anesthetic complications  Airway Mallampati: I TM Distance: >3 FB Neck ROM: full    Dental  (+) Teeth Intact   Pulmonary asthma (exercise induced - last inhaler use 1 month ago) ,  breath sounds clear to auscultation        Cardiovascular negative cardio ROS  Rhythm:regular Rate:Normal     Neuro/Psych negative neurological ROS  negative psych ROS   GI/Hepatic negative GI ROS, Neg liver ROS,   Endo/Other  Hyperthyroidism (s/p radioactive iodine, now hypothyroid)   Renal/GU negative Renal ROS  negative genitourinary   Musculoskeletal   Abdominal   Peds  Hematology negative hematology ROS (+)   Anesthesia Other Findings   Reproductive/Obstetrics (+) Pregnancy                           Anesthesia Physical Anesthesia Plan  ASA: II  Anesthesia Plan: Epidural   Post-op Pain Management:    Induction:   Airway Management Planned:   Additional Equipment:   Intra-op Plan:   Post-operative Plan:   Informed Consent: I have reviewed the patients History and Physical, chart, labs and discussed the procedure including the risks, benefits and alternatives for the proposed anesthesia with the patient or authorized representative who has indicated his/her understanding and acceptance.     Plan Discussed with:   Anesthesia Plan Comments:         Anesthesia Quick Evaluation

## 2011-03-22 NOTE — Progress Notes (Signed)
SVD of viable female per Dr Henderson Cloud.

## 2011-03-22 NOTE — Anesthesia Procedure Notes (Signed)
Epidural Patient location during procedure: OB Start time: 03/22/2011 1:19 PM Reason for block: procedure for pain  Staffing Performed by: anesthesiologist   Preanesthetic Checklist Completed: patient identified, site marked, surgical consent, pre-op evaluation, timeout performed, IV checked, risks and benefits discussed and monitors and equipment checked  Epidural Patient position: sitting Prep: site prepped and draped and DuraPrep Patient monitoring: continuous pulse ox and blood pressure Approach: midline Injection technique: LOR air  Needle:  Needle type: Tuohy  Needle gauge: 17 G Needle length: 9 cm Needle insertion depth: 4 cm Catheter type: closed end flexible Catheter size: 19 Gauge Catheter at skin depth: 9 cm Test dose: negative  Assessment Events: blood not aspirated, injection not painful, no injection resistance, negative IV test and no paresthesia  Additional Notes Discussed risk of headache, infection, bleeding, nerve injury and failed or incomplete block.  Patient voices understanding and wishes to proceed.

## 2011-03-22 NOTE — H&P (Signed)
Tammy Walton is a 36 y.o. female presenting at 40 weeks for AROM. Neg GBS History OB History    Grav Para Term Preterm Abortions TAB SAB Ect Mult Living   5 2 2  2  2   2      Past Medical History  Diagnosis Date  . CIN III (cervical intraepithelial neoplasia grade III) with severe dysplasia   . Recurrent pregnancy loss   . Hypothyroidism   . History of gestational diabetes in prior pregnancy, currently pregnant   . 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 History of chicken pox   . AMA (advanced maternal age) multigravida 35+   . Hyperthyroidism     175 mcg Levothyroxine QD  . Grave's disease   . Asthma with allergic rhinitis     rescue inhaler last use 01/13 Albuterol inh  . Asthma   . GERD (gastroesophageal reflux disease)   . Gestational diabetes     two previous pregnancies not this one  . Abnormal Pap smear    Past Surgical History  Procedure Date  . Cervical biopsy  w/ loop electrode excision 02/1996  . Wisdom tooth extraction   . Leep 1999   Family History: family history is not on file. Social History:  reports that she has never smoked. She does not have any smokeless tobacco history on file. She reports that she does not drink alcohol or use illicit drugs.  ROS  Dilation: 3.5 Effacement (%): 50 Blood pressure 110/73, pulse 88, temperature 97.7 F (36.5 C), temperature source Oral, resp. rate 18, height 5\' 5"  (1.651 m), weight 79.379 kg (175 lb), last menstrual period 06/15/2010. Maternal Exam:  Uterine Assessment: Contraction strength is mild.  Contraction frequency is irregular.   Abdomen: Patient reports no abdominal tenderness. Fundal height is c/w dates.   Estimated fetal weight is 7.5.   Fetal presentation: vertex  Pelvis: adequate for delivery.   Cervix: Cervix evaluated by digital exam.     Physical Exam  Prenatal labs: ABO, Rh: A/Negative/-- (08/10 0000) Antibody: Negative (08/10 0000) Rubella: Immune (08/10 0000) RPR: Nonreactive (08/10 0000)    HBsAg: Negative (08/10 0000)  HIV: Non-reactive (08/10 0000)  GBS: Negative (01/31 0000)   Assessment/Plan:IUP AT 40 WEEKS.  FAVORABLE CERVIX FOR AROM  Delman Goshorn S 03/22/2011, 7:47 AM

## 2011-03-23 ENCOUNTER — Encounter (HOSPITAL_COMMUNITY): Payer: Self-pay

## 2011-03-23 LAB — CBC
HCT: 29.4 % — ABNORMAL LOW (ref 36.0–46.0)
Hemoglobin: 9.9 g/dL — ABNORMAL LOW (ref 12.0–15.0)
MCH: 30.7 pg (ref 26.0–34.0)
MCHC: 33.7 g/dL (ref 30.0–36.0)
MCV: 91 fL (ref 78.0–100.0)
Platelets: 143 10*3/uL — ABNORMAL LOW (ref 150–400)
RBC: 3.23 MIL/uL — ABNORMAL LOW (ref 3.87–5.11)
RDW: 13.4 % (ref 11.5–15.5)
WBC: 8.7 10*3/uL (ref 4.0–10.5)

## 2011-03-23 LAB — RPR: RPR Ser Ql: NONREACTIVE

## 2011-03-23 NOTE — Progress Notes (Signed)
Post Partum Day 1 Subjective: no complaints, up ad lib, voiding and tolerating PO  Objective: Blood pressure 100/65, pulse 66, temperature 98.4 F (36.9 C), temperature source Oral, resp. rate 16, height 5\' 5"  (1.651 m), weight 79.379 kg (175 lb), last menstrual period 06/15/2010, SpO2 78.00%.  Physical Exam:  General: alert and cooperative Lochia: appropriate Uterine Fundus: firm Perineum intact DVT Evaluation: No evidence of DVT seen on physical exam.   Basename 03/23/11 0550 03/22/11 0745  HGB 9.9* 11.0*  HCT 29.4* 32.7*    Assessment/Plan: Plan for discharge tomorrow   LOS: 1 day   Razi Hickle G 03/23/2011, 8:39 AM

## 2011-03-24 MED ORDER — IBUPROFEN 600 MG PO TABS: 600.0000 mg | ORAL_TABLET | Freq: Four times a day (QID) | ORAL | Status: AC

## 2011-03-24 MED ORDER — OXYCODONE-ACETAMINOPHEN 5-325 MG PO TABS: 1.0000 | ORAL_TABLET | ORAL | Status: AC | PRN

## 2011-03-24 MED ORDER — SERTRALINE HCL 50 MG PO TABS: 50.0000 mg | ORAL_TABLET | Freq: Every day | ORAL | Status: AC

## 2011-03-24 MED ORDER — SERTRALINE HCL 50 MG PO TABS
50.0000 mg | ORAL_TABLET | Freq: Every day | ORAL | Status: DC
  Administered 2011-03-24: 50 mg via ORAL
  Filled 2011-03-24: qty 1

## 2011-03-24 MED ORDER — PRENATAL MULTIVITAMIN CH: 1.0000 | ORAL_TABLET | Freq: Every day | ORAL | Status: DC

## 2011-03-24 NOTE — Progress Notes (Signed)
CLINICAL SOCIAL WORK  BRIEF PSYCHOSOCIAL ASSESSMENT  Referred by: NICU     On: 03/23/11    For: NICU Support      Patient Interview _X_Family Interview-FOB   Other: MOB's chart  PSYCHOSOCIAL DATA:   Lives Alone  Lives with: baby to be discharged to parents' home   Primary Support (Name/Relationship): Tammy Walton/parents Degree of support available: [place] support system of family and friends in the area.  CURRENT CONCERNS:     _X_None noted Substance Abuse     Behavioral Health Issues    Financial Resources     Abuse/Neglect/Domestic Violence   Cultural/Religious Issues     Post-Acute Placement    Adjustment to Illness     Knowledge/Cognitive Deficit      Other:     SOCIAL WORK ASSESSMENT/PLAN:  SW met with FOB at baby's bedside to introduce myself and complete assessment.  SW explained support services offered by NICU SW and inquired about any current needs or concerns.  SW asked how MOB is coping with the situation and requested that FOB let her know that she can call at any time if there is anything SW can do for her.  SW will attempt to meet MOB as well.  FOB appears to be coping well.  SW has no social concerns at this time.  No Further Intervention Required  _X_Psychosocial Support/Ongoing Assessment of Needs Information/Referral to [facility] Other  PATIENT'S/FAMILY'S RESPONSE TO PLAN OF CARE:  FOB was very friendly and seemed to appreciate SW's visit.  He states that the transfer to NICU was unexpected, but that they are coping well and are glad baby is doing much better.  He states he and his wife are doing well and that both sets of grandparents live in the area and are helping with their other children while they are in the hospital.  He reports having everything they need for baby and that his wife will have 12 weeks maternity leave from [facility].  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  He reports that their first child had problems with hypoglycemia, although did not have to be cared for in the NICU.  He thanked SW for the visit and states no questions or needs at this time.

## 2011-03-24 NOTE — Discharge Summary (Signed)
Obstetric Discharge Summary Reason for Admission: induction of labor Prenatal Procedures: ultrasound Intrapartum Procedures: spontaneous vaginal delivery Postpartum Procedures: none Complications-Operative and Postpartum: 2 degree perineal laceration Hemoglobin  Date Value Range Status  03/23/2011 9.9* 12.0-15.0 (g/dL) Final     HCT  Date Value Range Status  03/23/2011 29.4* 36.0-46.0 (%) Final    Discharge Diagnoses: Term Pregnancy-delivered  Discharge Information: Date: 03/24/2011 Activity: pelvic rest Diet: routine Medications: PNV, Ibuprofen, Percocet and zoloft Condition: stable Instructions: refer to practice specific booklet Discharge to: home   Newborn Data: Live born female  Birth Weight: 8 lb 0.8 oz (3651 g) APGAR: 8, 9  Home with nicu.  CURTIS,CAROL G 03/24/2011, 9:01 AM

## 2011-03-24 NOTE — Progress Notes (Signed)
Post Partum Day 2 Subjective: no complaints, up ad lib, voiding and baby in NICU for hypoglycemia  Objective: Blood pressure 108/66, pulse 60, temperature 97.7 F (36.5 C), temperature source Oral, resp. rate 20, height 5\' 5"  (1.651 m), weight 79.379 kg (175 lb), last menstrual period 06/15/2010, SpO2 98.00%, unknown if currently breastfeeding.  Physical Exam:  General: alert and cooperative Lochia: appropriate Uterine Fundus: firm Perineum intact DVT Evaluation: No evidence of DVT seen on physical exam.   Basename 03/23/11 0550 03/22/11 0745  HGB 9.9* 11.0*  HCT 29.4* 32.7*    Assessment/Plan: Discharge home   LOS: 2 days   CURTIS,CAROL G 03/24/2011, 8:29 AM

## 2011-04-01 NOTE — Anesthesia Postprocedure Evaluation (Signed)
Patient stable following vaginal delivery.  

## 2011-05-04 ENCOUNTER — Telehealth: Payer: Self-pay | Admitting: *Deleted

## 2011-05-04 NOTE — Telephone Encounter (Signed)
Pt called and left message wanting to speak with JF about being on mirena iud, left message for pt to call to make OV.

## 2011-06-08 ENCOUNTER — Ambulatory Visit (INDEPENDENT_AMBULATORY_CARE_PROVIDER_SITE_OTHER): Payer: BC Managed Care – PPO | Admitting: Gynecology

## 2011-06-08 ENCOUNTER — Encounter: Payer: Self-pay | Admitting: Gynecology

## 2011-06-08 VITALS — BP 124/80

## 2011-06-08 DIAGNOSIS — Z8632 Personal history of gestational diabetes: Secondary | ICD-10-CM | POA: Insufficient documentation

## 2011-06-08 DIAGNOSIS — E039 Hypothyroidism, unspecified: Secondary | ICD-10-CM | POA: Insufficient documentation

## 2011-06-08 DIAGNOSIS — Z975 Presence of (intrauterine) contraceptive device: Secondary | ICD-10-CM

## 2011-06-08 DIAGNOSIS — Z30430 Encounter for insertion of intrauterine contraceptive device: Secondary | ICD-10-CM

## 2011-06-08 NOTE — Patient Instructions (Signed)
Intrauterine Device Information An intrauterine device (IUD) is inserted into your uterus and prevents pregnancy. There are 2 types of IUDs available:  Copper IUD. This type of IUD is wrapped in copper wire and is placed inside the uterus. Copper makes the uterus and fallopian tubes produce a fluid that kills sperm. The copper IUD can stay in place for 10 years.   Hormone IUD. This type of IUD contains the hormone progestin (synthetic progesterone). The hormone thickens the cervical mucus and prevents sperm from entering the uterus, and it also thins the uterine lining to prevent implantation of a fertilized egg. The hormone can weaken or kill the sperm that get into the uterus. The hormone IUD can stay in place for 5 years.  Your caregiver will make sure you are a good candidate for a contraceptive IUD. Discuss with your caregiver the possible side effects. ADVANTAGES  It is highly effective, reversible, long-acting, and low maintenance.   There are no estrogen-related side effects.   An IUD can be used when breastfeeding.   It is not associated with weight gain.   It works immediately after insertion.   The copper IUD does not interfere with your female hormones.   The progesterone IUD can make heavy menstrual periods lighter.   The progesterone IUD can be used for 5 years.   The copper IUD can be used for 10 years.  DISADVANTAGES  The progesterone IUD can be associated with irregular bleeding patterns.   The copper IUD can make your menstrual flow heavier and more painful.   You may experience cramping and vaginal bleeding after insertion.  Document Released: 12/07/2003 Document Revised: 12/22/2010 Document Reviewed: 05/07/2010 [employer] Patient Information 2012 [employer]. 

## 2011-06-08 NOTE — Progress Notes (Signed)
Patient a 36 year old gravida 5 para 3 AB 2 who had a normal spontaneous vaginal delivery 03/22/2011. She has been using barrier for contraception. Presented to the office today to have a ParaGard IUD placed. She had read information before. The risks benefits and pros and cons were discussed. Patient fully where this form of contraception is good for 10 years and is 99% effective. Patient with history of hypothyroidism under control and has had gestational diabetes in her first 2 pregnancies.  Exam: Pelvic: Bartholin urethra Skene was within normal limits Vagina: Menstrual blood present Cervix: No lesions noted Uterus: Anteverted normal size shape and consistency Adnexa: No palpable masses or tenderness Rectal: Not examined  Procedure note: After the speculum was inserted the vagina was swept clear of the menstrual blood with a gauze sterile. The cervix was then cleansed with Betadine solution. The anterior cervical lip was grasped with a single-tooth tenaculum. The uterus sounded to 7 cm. The ParaGard T380A IUD was placed in a sterile fashion. The single-tooth tenaculum was removed the string was trimmed.  Assessment/plan: Patient had a ParaGard T380A IUD placed today. Patient will return back in one month for followup. She returns in one month she'll come in a fasting states we can check her fasting blood sugar since she had gestational diabetes with 2 prior pregnancies. We'll also check her TSH. Literature information was provided and we'll follow accordingly.

## 2011-07-14 ENCOUNTER — Ambulatory Visit: Payer: BC Managed Care – PPO | Admitting: Gynecology

## 2011-07-17 ENCOUNTER — Encounter: Payer: Self-pay | Admitting: Gynecology

## 2011-07-17 ENCOUNTER — Ambulatory Visit (INDEPENDENT_AMBULATORY_CARE_PROVIDER_SITE_OTHER): Payer: BC Managed Care – PPO | Admitting: Gynecology

## 2011-07-17 VITALS — BP 110/70

## 2011-07-17 DIAGNOSIS — Z30431 Encounter for routine checking of intrauterine contraceptive device: Secondary | ICD-10-CM

## 2011-07-17 NOTE — Progress Notes (Signed)
Patient presented to the office today for followup after having placed a ParaGard T380A IUD on 06/08/2011. Patient doing well is asymptomatic.  Exam: Abdomen soft nontender no rebound or guarding Pelvic: Bartholin urethra Skene was within normal limits Vagina: No lesions or discharge Cervix: IUD string seen Uterus: Anteverted normal size shape and consistency Adnexa: No palpable masses or tenderness Rectal: Not examined  Assessment/plan: Patient status post placement ParaGard T380A IUD one month ago. Patient is doing well. Examination today unremarkable. Patient scheduled to return back next year for her annual exam. Patient is aware that this form of contraception is good for 10 years.

## 2012-08-28 ENCOUNTER — Encounter: Payer: Self-pay | Admitting: Gynecology

## 2012-09-26 ENCOUNTER — Encounter: Payer: Self-pay | Admitting: Gynecology

## 2012-10-10 ENCOUNTER — Other Ambulatory Visit (HOSPITAL_COMMUNITY)
Admission: RE | Admit: 2012-10-10 | Discharge: 2012-10-10 | Disposition: A | Discharge status: Home / Self Care | Payer: BC Managed Care – PPO | Source: Ambulatory Visit | Attending: Gynecology | Admitting: Gynecology

## 2012-10-10 ENCOUNTER — Encounter: Payer: Self-pay | Admitting: Gynecology

## 2012-10-10 ENCOUNTER — Ambulatory Visit (INDEPENDENT_AMBULATORY_CARE_PROVIDER_SITE_OTHER): Payer: BC Managed Care – PPO | Admitting: Gynecology

## 2012-10-10 VITALS — BP 112/70 | Ht 64.0 in | Wt 160.0 lb

## 2012-10-10 DIAGNOSIS — Z01419 Encounter for gynecological examination (general) (routine) without abnormal findings: Secondary | ICD-10-CM | POA: Diagnosis present

## 2012-10-10 DIAGNOSIS — Z87410 Personal history of cervical dysplasia: Secondary | ICD-10-CM

## 2012-10-10 DIAGNOSIS — Z1151 Encounter for screening for human papillomavirus (HPV): Secondary | ICD-10-CM | POA: Insufficient documentation

## 2012-10-10 DIAGNOSIS — R635 Abnormal weight gain: Secondary | ICD-10-CM

## 2012-10-10 MED ORDER — CEPHALEXIN 500 MG PO CAPS: 500.0000 mg | ORAL_CAPSULE | Freq: Three times a day (TID) | ORAL | Status: DC

## 2012-10-10 MED ORDER — FLUCONAZOLE 150 MG PO TABS: 150.0000 mg | ORAL_TABLET | Freq: Once | ORAL | Status: DC

## 2012-10-10 NOTE — Progress Notes (Signed)
Tammy Walton March 10, 1975 [id number]   History:    37 y.o.  for annual gyn exam with 2 concerns one that she had noticed a a small nodularity at the base of her neck underneath her hair which was nontender. The second concern that she had is that her mother was diagnosed with breast cancer and had a radical mastectomy and is currently on tamoxifen. Patient had a mammogram in 2007 which was normal.   Patient has had past history of hyperthyroidism treated with iodine-131 and subsequently developed hypothyroidism and has been followed by the endocrinologist Dr. Lurene Shadow who recently did her thyroid function tests. Patient is currently taking levothyroxine and 125 mcg for 5 days out of the week and on the weekend she takes 200 mcg. Patient declines flu shot. Her Tdap pexied is up to date. She did a finger stick blood sugar at home this morning stating was 86. She did have history of gestational diabetes with one of her pregnancies.  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 Subsequent Pap smears have been normal. Patient had a ParaGard T380A IUD placed on May of this year. Her cycles are monthly lasting 5 days.  Past medical history,surgical history, family history and social history were all reviewed and documented in the EPIC chart.  Gynecologic History Patient's last menstrual period was 09/26/2012. Contraception: IUD Last Pap: 2013. Results were: normal Last mammogram: 2007. Results were: normal  Obstetric History OB History  Gravida Para Term Preterm AB SAB TAB Ectopic Multiple Living  5 3 3  0 2 2 0 0 0 3    # Outcome Date GA Lbr Len/2nd Weight Sex Delivery Anes PTL Lv  5 TRM 03/22/11 [redacted]w[redacted]d 08:18 / 00:40 8 lb 0.8 oz (3.651 kg) M SVD EPI  Y     Comments: caput, moulding  4 SAB 12/2009 [redacted]w[redacted]d         3 SAB 2011 [redacted]w[redacted]d            Comments: chemical preg  2 TRM 2008 [redacted]w[redacted]d 08:00 7 lb 11 oz (3.487 kg) F SVD EPI  Y  1 TRM 2006 [redacted]w[redacted]d  08:00 7 lb 6 oz (3.345 kg) M SVD EPI  Y       ROS: A ROS was performed and pertinent positives and negatives are included in the history.  GENERAL: No fevers or chills. HEENT: No change in vision, no earache, sore throat or sinus congestion. NECK: small nodule base of the neck CARDIOVASCULAR: No chest pain or pressure. No palpitations. PULMONARY: No shortness of breath, cough or wheeze. GASTROINTESTINAL: No abdominal pain, nausea, vomiting or diarrhea, melena or bright red blood per rectum. GENITOURINARY: No urinary frequency, urgency, hesitancy or dysuria. MUSCULOSKELETAL: No joint or muscle pain, no back pain, no recent trauma. DERMATOLOGIC: No rash, no itching, no lesions. ENDOCRINE: No polyuria, polydipsia, no heat or cold intolerance. No recent change in weight. HEMATOLOGICAL: No anemia or easy bruising or bleeding. NEUROLOGIC: No headache, seizures, numbness, tingling or weakness. PSYCHIATRIC: No depression, no loss of interest in normal activity or change in sleep pattern.     Exam: chaperone present  BP 112/70  Ht 5\' 4"  (1.626 m)  Wt 160 lb (72.576 kg)  BMI 27.45 kg/m2  LMP 09/26/2012  Body mass index is 27.45 kg/(m^2).  General appearance : Well developed well nourished female. No acute distress HEENT: Neck supple, trachea midline, no carotid bruits, no thyroidmegaly, base of neck occipital area subdermal  folliculitis? Nontender Lungs: Clear to auscultation, no rhonchi or wheezes, or rib retractions  Heart: Regular rate and rhythm, no murmurs or gallops Breast:Examined in sitting and supine position were symmetrical in appearance, no palpable masses or tenderness,  no skin retraction, no nipple inversion, no nipple discharge, no skin discoloration, no axillary or supraclavicular lymphadenopathy Abdomen: no palpable masses or tenderness, no rebound or guarding Extremities: no edema or skin discoloration or tenderness  Pelvic:  Bartholin, Urethra, Skene Glands: Within normal  limits             Vagina: No gross lesions or discharge  Cervix: No gross lesions or discharge,IUD string seen  Uterus  anteverted, normal size, shape and consistency, non-tender and mobile  Adnexa  Without masses or tenderness  Anus and perineum  normal   Rectovaginal  normal sphincter tone without palpated masses or tenderness             Hemoccult not indicated     Assessment/Plan:patient with small suboccipital skin sebaceous cyst? A trial Keflex 500 mg 3 times a day for 10 days will be provided and she will return back to the office for followup. A Gail model risk assessment for breast cancer was provided to the patient.  She will check with her mother if she was testedfor the BRCA1 and BRCA2 gene mutation. Pap smear was done today. The following labs were ordered: Patient will return to the office in a fasting state to obtain the following labs: Fasting lipid profile, copious metabolic panel, CBC, and urinalysis. Patient was instructed to do her monthly breast exam. I have recommended the patient have a 3-D mammogram scheduled.      Ok Edwards MD, 4:41 PM 10/10/2012

## 2012-10-10 NOTE — Patient Instructions (Addendum)
BRCA-1 and BRCA-2 BRCA-1 and BRCA-2 are 2 genes that are linked with hereditary breast and ovarian cancers. About 200,000 women are diagnosed with invasive breast cancer each year and about 23,000 with ovarian cancer (according to the American Cancer Society). Of these cancers, about 5% to 10% will be due to a mutation in one of the BRCA genes. Men can also inherit an increased risk of developing breast cancer, primarily from an alteration in the BRCA-2 gene.  Individuals with mutations in BRCA1 or BRCA2 have significantly elevated risks for breast cancer (up to 80% lifetime risk), ovarian cancer (up to 40% lifetime risk), bilateral breast cancer and other types of cancers. BRCA mutations are inherited and passed from generation to generation. One half of the time, they are passed from the father's side of the family.  The DNA in white blood cells is used to detect mutations in the BRCA genes. While the gene products (proteins) of the BRCA genes act only in breast and ovarian tissue, the genes are present in every cell of the body and blood is the most easily accessible source of that DNA. PREPARATION FOR TEST The test for BRCA mutations is done on a blood sample collected by needle from a vein in the arm. The test does not require surgical biopsy of breast or ovarian tissue.  NORMAL FINDINGS No genetic mutations. Ranges for normal findings may vary among different laboratories and hospitals. You should always check with your doctor after having lab work or other tests done to discuss the meaning of your test results and whether your values are considered within normal limits. MEANING OF TEST  Your caregiver will go over the test results with you and discuss the importance and meaning of your results, as well as treatment options and the need for additional tests if necessary. OBTAINING THE TEST RESULTS It is your responsibility to obtain your test results. Ask the lab or department performing the test  when and how you will get your results. OTHER THINGS TO KNOW Your test results may have implications for other family members. When one member of a family is tested for BRCA mutations, issues often arise about how or whether to share this information with other family members. Seek advice from a genetic counselor about communication of result with your family members.  Pre and post test consultation with a health care provider knowledgeable about genetic testing cannot be overemphasized.  There are many issues to be considered when preparing for a genetic test and upon learning the results, and a genetic counselor has the knowledge and experience to help you sort through them.  If the BRCA test is positive, the options include increased frequency of check-ups (e.g., mammography, blood tests for CA-125, or transvaginal ultrasonography); medications that could reduce risk (e.g., oral contraceptives or tamoxifen); or surgical removal of the ovaries or breasts. There are a number of variables involved and it is important to discuss your options with your doctor and genetic counselor. Research studies have reported that for every 1000 women negative for BRCA mutations, between 12 and 45 of them will develop breast cancer by age 50 and between 3 and 4 will develop ovarian cancer by age 50. The risk increases with age. The test can be ordered by a doctor, preferably by one who can also offer genetic counseling. The blood sample will be sent to a laboratory that specializes in BRCA testing. The American Society of Clinical Oncology and the National Breast Cancer Coalition encourage women seeking the   test to participate in long-term outcome studies to help gather information on the effectiveness of different check-up and treatment options. Document Released: 01/27/2004 Document Revised: 03/27/2011 Document Reviewed: 12/09/2007 [employer] Patient Information 2014 [employer].  

## 2012-10-11 ENCOUNTER — Encounter: Payer: Self-pay | Admitting: Gynecology

## 2012-10-15 ENCOUNTER — Other Ambulatory Visit: Payer: BC Managed Care – PPO

## 2013-02-18 ENCOUNTER — Telehealth: Payer: Self-pay

## 2013-02-18 NOTE — Telephone Encounter (Signed)
Having labs drawn at Dr. Chalmers Cater tomorrow and said she is overdue to have some labs repeated here.  She wants to have them there is possible. I told her that is fine. She will need to check with Dr. Almetta Lovely office to be sure they are okay with it as they will need to fax results to [place].  She will find out how we can get an order to them and let me know.

## 2013-05-27 ENCOUNTER — Other Ambulatory Visit: Payer: Self-pay

## 2013-05-27 ENCOUNTER — Encounter (INDEPENDENT_AMBULATORY_CARE_PROVIDER_SITE_OTHER): Payer: Self-pay

## 2013-05-27 ENCOUNTER — Ambulatory Visit
Admission: RE | Admit: 2013-05-27 | Discharge: 2013-05-27 | Disposition: A | Discharge status: Home / Self Care | Payer: BC Managed Care – PPO | Source: Ambulatory Visit

## 2013-05-27 DIAGNOSIS — Z1231 Encounter for screening mammogram for malignant neoplasm of breast: Secondary | ICD-10-CM

## 2013-11-17 ENCOUNTER — Encounter: Payer: Self-pay | Admitting: Gynecology

## 2015-01-21 ENCOUNTER — Encounter: Payer: Self-pay | Admitting: Gynecology

## 2015-04-08 ENCOUNTER — Encounter: Payer: Self-pay | Admitting: Gynecology

## 2015-07-14 ENCOUNTER — Ambulatory Visit (INDEPENDENT_AMBULATORY_CARE_PROVIDER_SITE_OTHER): Payer: BLUE CROSS/BLUE SHIELD | Admitting: Gynecology

## 2015-07-14 ENCOUNTER — Encounter: Payer: Self-pay | Admitting: Gynecology

## 2015-07-14 VITALS — BP 122/80 | Ht 65.0 in | Wt 158.0 lb

## 2015-07-14 DIAGNOSIS — R102 Pelvic and perineal pain: Secondary | ICD-10-CM

## 2015-07-14 DIAGNOSIS — Z30432 Encounter for removal of intrauterine contraceptive device: Secondary | ICD-10-CM | POA: Diagnosis not present

## 2015-07-14 DIAGNOSIS — Z01419 Encounter for gynecological examination (general) (routine) without abnormal findings: Secondary | ICD-10-CM

## 2015-07-14 DIAGNOSIS — F3281 Premenstrual dysphoric disorder: Secondary | ICD-10-CM

## 2015-07-14 DIAGNOSIS — Z87410 Personal history of cervical dysplasia: Secondary | ICD-10-CM

## 2015-07-14 NOTE — Progress Notes (Signed)
Tammy Walton October 03, 1975 [id number]   History:    39 y.o.  for annual gyn exam who states that a week to 10 days before her menses she becomes very irritable and emotional. She has a ParaGard T380A IUD and her cycles are very heavy and lasting anywhere between 5-7 days. Her husband had a vasectomy last year and is only had 1 semen analysis and is overdue to check a semen analysis once again before not using any form of contraception.  Patient has had past history of hyperthyroidism treated with iodine-131 and subsequently developed hypothyroidism and has been followed by the endocrinologist Dr. Bubba Camp who recently did her thyroid function tests. Patient was switched from levothyroxine throat or thyroid 120 MG daily.  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 Subsequent Pap smears have been normal.  Patient's been complaining of low abdominal discomfort especially in writing. She would like to have the IUD removed today.  Past medical history,surgical history, family history and social history were all reviewed and documented in the EPIC chart.  Gynecologic History Patient's last menstrual period was 06/25/2015. Contraception: IUD Last Pap: 2014. Results were: normal Last mammogram: 2015. Results were: Normal but dense had three-dimensional mammogram  Obstetric History OB History  Gravida Para Term Preterm AB SAB TAB Ectopic Multiple Living  5 3 3  0 2 2 0 0 0 3    # Outcome Date GA Lbr Len/2nd Weight Sex Delivery Anes PTL Lv  5 Term 03/22/11 [redacted]w[redacted]d 08:18 / 00:40 8 lb 0.8 oz (3.651 kg) M Vag-Spont EPI,Local  Y     Comments: caput, moulding  4 SAB 12/2009 [redacted]w[redacted]d         3 SAB 2011 [redacted]w[redacted]d            Comments: chemical preg  2 Term 2008 [redacted]w[redacted]d 08:00 7 lb 11 oz (3.487 kg) F Vag-Spont EPI  Y  1 Term 2006 [redacted]w[redacted]d 08:00 7 lb 6 oz (3.345 kg) M Vag-Spont EPI  Y       ROS: A ROS was performed and pertinent positives and negatives  are included in the history.  GENERAL: No fevers or chills. HEENT: No change in vision, no earache, sore throat or sinus congestion. NECK: No pain or stiffness. CARDIOVASCULAR: No chest pain or pressure. No palpitations. PULMONARY: No shortness of breath, cough or wheeze. GASTROINTESTINAL: No abdominal pain, nausea, vomiting or diarrhea, melena or bright red blood per rectum. GENITOURINARY: No urinary frequency, urgency, hesitancy or dysuria. MUSCULOSKELETAL: No joint or muscle pain, no back pain, no recent trauma. DERMATOLOGIC: No rash, no itching, no lesions. ENDOCRINE: No polyuria, polydipsia, no heat or cold intolerance. No recent change in weight. HEMATOLOGICAL: No anemia or easy bruising or bleeding. NEUROLOGIC: No headache, seizures, numbness, tingling or weakness. PSYCHIATRIC: No depression, no loss of interest in normal activity or change in sleep pattern.     Exam: chaperone present  BP 122/80 mmHg  Ht 5\' 5"  (1.651 m)  Wt 158 lb (71.668 kg)  BMI 26.29 kg/m2  LMP 06/25/2015  Body mass index is 26.29 kg/(m^2).  General appearance : Well developed well nourished female. No acute distress HEENT: Eyes: no retinal hemorrhage or exudates,  Neck supple, trachea midline, no carotid bruits, no thyroidmegaly Lungs: Clear to auscultation, no rhonchi or wheezes, or rib retractions  Heart: Regular rate and rhythm, no murmurs or gallops Breast:Examined in sitting and supine position were symmetrical in appearance, no palpable masses  or tenderness,  no skin retraction, no nipple inversion, no nipple discharge, no skin discoloration, no axillary or supraclavicular lymphadenopathy Abdomen: no palpable masses or tenderness, no rebound or guarding Extremities: no edema or skin discoloration or tenderness  Pelvic:  Bartholin, Urethra, Skene Glands: Within normal limits             Vagina: No gross lesions or discharge  Cervix: No gross lesions or discharge, IUD string visualized  Uterus  anteverted,  normal size, shape and consistency, non-tender and mobile  Adnexa  Without masses or tenderness  Anus and perineum  normal   Rectovaginal  normal sphincter tone without palpated masses or tenderness             Hemoccult not indicated   The IUD was grasped with a [place] clamp retrieved shown to the patient and discarded  Assessment/Plan:  40 y.o. female for annual exam will return to the office in 2 weeks in a fasting state for the following fasting blood work: Comprehensive metabolic panel, fasting lipid profile, CBC and urinalysis. We'll also do an ultrasound that she was mostly tender on her left lower quadrant and I could not get a good feel for her left ovary. Meanwhile she would use condoms for contraception until her husband gets his semen analysis. I have provided her with literature information on PMDD and discuss possibility of starting her on low-dose Prozac for 2 weeks on 2 weeks off to help with her symptoms. I would like for her to try the Mirena IUD to help for her cycle control as well and if her symptoms improve she may not need Prozac. Pap smear was done today without HPV screening she was provided with a requisition to schedule her overdue mammogram.   Terrance Mass MD, 2:49 PM 07/14/2015

## 2015-07-14 NOTE — Patient Instructions (Addendum)
Levonorgestrel intrauterine device (IUD) What is this medicine? LEVONORGESTREL IUD (LEE voe nor jes trel) is a contraceptive (birth control) device. The device is placed inside the uterus by a healthcare professional. It is used to prevent pregnancy and can also be used to treat heavy bleeding that occurs during your period. Depending on the device, it can be used for 3 to 5 years. This medicine may be used for other purposes; ask your health care provider or pharmacist if you have questions. What should I tell my health care provider before I take this medicine? They need to know if you have any of these conditions: -abnormal Pap smear -cancer of the breast, uterus, or cervix -diabetes -endometritis -genital or pelvic infection now or in the past -have more than one sexual partner or your partner has more than one partner -heart disease -history of an ectopic or tubal pregnancy -immune system problems -IUD in place -liver disease or tumor -problems with blood clots or take blood-thinners -use intravenous drugs -uterus of unusual shape -vaginal bleeding that has not been explained -an unusual or allergic reaction to levonorgestrel, other hormones, silicone, or polyethylene, medicines, foods, dyes, or preservatives -pregnant or trying to get pregnant -breast-feeding How should I use this medicine? This device is placed inside the uterus by a health care professional. Talk to your pediatrician regarding the use of this medicine in children. Special care may be needed. Overdosage: If you think you have taken too much of this medicine contact a poison control center or emergency room at once. NOTE: This medicine is only for you. Do not share this medicine with others. What if I miss a dose? This does not apply. What may interact with this medicine? Do not take this medicine with any of the following medications: -amprenavir -bosentan -fosamprenavir This medicine may also interact with  the following medications: -aprepitant -barbiturate medicines for inducing sleep or treating seizures -bexarotene -griseofulvin -medicines to treat seizures like carbamazepine, ethotoin, felbamate, oxcarbazepine, phenytoin, topiramate -modafinil -pioglitazone -rifabutin -rifampin -rifapentine -some medicines to treat HIV infection like atazanavir, indinavir, lopinavir, nelfinavir, tipranavir, ritonavir -St. John's wort -warfarin This list may not describe all possible interactions. Give your health care provider a list of all the medicines, herbs, non-prescription drugs, or dietary supplements you use. Also tell them if you smoke, drink alcohol, or use illegal drugs. Some items may interact with your medicine. What should I watch for while using this medicine? Visit your doctor or health care professional for regular check ups. See your doctor if you or your partner has sexual contact with others, becomes HIV positive, or gets a sexual transmitted disease. This product does not protect you against HIV infection (AIDS) or other sexually transmitted diseases. You can check the placement of the IUD yourself by reaching up to the top of your vagina with clean fingers to feel the threads. Do not pull on the threads. It is a good habit to check placement after each menstrual period. Call your doctor right away if you feel more of the IUD than just the threads or if you cannot feel the threads at all. The IUD may come out by itself. You may become pregnant if the device comes out. If you notice that the IUD has come out use a backup birth control method like condoms and call your health care provider. Using tampons will not change the position of the IUD and are okay to use during your period. What side effects may I notice from receiving this medicine?   Side effects that you should report to your doctor or health care professional as soon as possible: -allergic reactions like skin rash, itching or  hives, swelling of the face, lips, or tongue -fever, flu-like symptoms -genital sores -high blood pressure -no menstrual period for 6 weeks during use -pain, swelling, warmth in the leg -pelvic pain or tenderness -severe or sudden headache -signs of pregnancy -stomach cramping -sudden shortness of breath -trouble with balance, talking, or walking -unusual vaginal bleeding, discharge -yellowing of the eyes or skin Side effects that usually do not require medical attention (report to your doctor or health care professional if they continue or are bothersome): -acne -breast pain -change in sex drive or performance -changes in weight -cramping, dizziness, or faintness while the device is being inserted -headache -irregular menstrual bleeding within first 3 to 6 months of use -nausea This list may not describe all possible side effects. Call your doctor for medical advice about side effects. You may report side effects to FDA at 1-800-FDA-1088. Where should I keep my medicine? This does not apply. NOTE: This sheet is a summary. It may not cover all possible information. If you have questions about this medicine, talk to your doctor, pharmacist, or health care provider.    2016, Elsevier/Gold Standard. (2011-02-02 13:54:04)  Premenstrual Syndrome Premenstrual syndrome (PMS) is a condition that consists of physical, emotional, and behavioral symptoms that affect women of childbearing age. PMS occurs 5-14 days before the start of a menstrual period and often recurs in a predictable pattern. The symptoms go away a few days after the menstrual period starts. PMS can interfere in many ways with normal daily activities and can range from mild to severe. When PMS is considered severe, it may be diagnosed as premenstrual dysphoric disorder (PMDD). A small percentage of women are affected by PMS symptoms and an even smaller percentage of those women are affected by PMDD.  CAUSES  The exact cause  of PMS is unknown, but it seems to be related to cyclic hormone changes that happen before menstruation. These hormones are thought to affect chemicals in the brain (serotonin) that can influence a person's mood.  SYMPTOMS  Symptoms of PMS recur consistently from month to month and go away completely after the menstrual period starts. The most common emotional or behavioral symptom is mood swings. These mood swings can be disabling and interfere with normal activities of daily living. Other common symptoms include depression and angry outbursts. Other symptoms may include:   Irritability.  Anxiety.  Crying spells.   Food cravings or appetite changes.   Changes in sexual desire.   Confusion.   Aggression.   Social withdrawal.   Poor concentration. The most common physical symptoms include a sense of bloating, breast pain, headaches, and extreme fatigue. Other physical symptoms include:   Backaches.   Swelling of the hands and feet.   Weight gain.   Hot flashes.  DIAGNOSIS  To make a diagnosis, your caregiver will ask questions to confirm that you are having a pattern of symptoms. Symptoms must:   Be present 5 days before the start of your period and be present at least 3 months in a row.   End within 4 days after your period starts.   Interfere with some of your normal activities.  Other conditions, such as thyroid disease, depression, and migraine headaches must be ruled out before a diagnosis of PMS is confirmed.  TREATMENT  Your caregiver may suggest ways to maintain a healthy lifestyle, such  as exercise. Over-the-counter pain relievers may ease cramps, aches, pains, headaches, and breast tenderness. However, selective serotonin reuptake inhibitors (SSRIs) are medicines that are most beneficial in improving PMS if taken in the second half of the monthly cycle. They may be taken on a daily basis. The most effective oral contraceptive pill used for symptoms of PMS  is one that contains the ingredient drospirenone. Taking 4 days off of the pill instead of the usual 7 days also has shown to increase effectiveness.  There are a number of drugs, dietary supplements, vitamins, and water pills (diuretics) which have been suggested to be helpful but have not shown to be of any benefit to improving PMS symptoms.  HOME CARE INSTRUCTIONS   For 2-3 months, write down your symptoms, their severity, and how long they last. This may help your caregiver prescribe the best treatment for your symptoms.  Exercise regularly as suggested by your caregiver.  Eat a regular, well-balanced diet.  Avoid caffeine, alcohol, and tobacco consumption.  Limit salt and salty foods to lessen bloating and fluid retention.  Get enough sleep. Practice relaxation techniques.  Drink enough fluids to keep your urine clear or pale yellow.  Take medicines as directed by your caregiver.  Limit stress.  Take a multivitamin as directed by your caregiver.   This information is not intended to replace advice given to you by your health care provider. Make sure you discuss any questions you have with your health care provider.   Document Released: 12/31/1999 Document Revised: 09/27/2011 Document Reviewed: 05/22/2011 Elsevier Interactive Patient Education 2016 [employer].  Fluoxetine capsules or tablets (PMDD indication) What is this medicine? FLUOXETINE (floo OX e teen) belongs to a class of drugs known as selective serotonin reuptake inhibitors (SSRIs). It is used for premenstrual dysphoric disorder (PMDD). PMDD causes intense mood and physical symptoms a week or two before your period every month. This drug helps improve mood swings, tiredness, tension, and breast tenderness. This medicine may be used for other purposes; ask your health care provider or pharmacist if you have questions. What should I tell my health care provider before I take this medicine? They need to know if you  have any of these conditions: -bipolar disorder or mania -diabetes -glaucoma -liver disease -psychosis -seizures -suicidal thoughts or history of attempted suicide -an unusual or allergic reaction to fluoxetine, other medicines, foods, dyes, or preservatives -pregnant or trying to get pregnant -breast-feeding How should I use this medicine? Take this medicine by mouth with a glass of water. Follow the directions on the prescription label. You can take it with or without food. Take your medicine at regular intervals. Do not take it more often than directed. Do not stop taking this medicine suddenly except upon the advice of your doctor. Stopping this medicine too quickly may cause serious side effects or your condition may worsen. A special MedGuide will be given to you by the pharmacist with each prescription and refill. Be sure to read this information carefully each time. Talk to your pediatrician regarding the use of this medicine in children. Special care may be needed. Overdosage: If you think you have taken too much of this medicine contact a poison control center or emergency room at once. NOTE: This medicine is only for you. Do not share this medicine with others. What if I miss a dose? If you miss a dose, skip the missed dose and go back to your regular dosing schedule. Do not take double or extra doses. What  may interact with this medicine? Do not take fluoxetine with any of the following medications: -other medicines containing fluoxetine, like Prozac or Symbyax -cisapride -linezolid -MAOIs like Carbex, Eldepryl, Marplan, Nardil, and Parnate -methylene blue (injected into a vein) -pimozide -thioridazine Fluoxetine may also interact with the following medications: -alcohol -aspirin and aspirin-like medicines -carbamazepine -certain medicines for depression, anxiety, or psychotic disturbances -certain medicines for migraine headaches like almotriptan, eletriptan,  frovatriptan, naratriptan, rizatriptan, sumatriptan, zolmitriptan -digoxin -diuretics -fentanyl -flecainide -furazolidone -isoniazid -lithium -medicines for sleep -medicines that treat or prevent blood clots like warfarin, enoxaparin, and dalteparin -NSAIDs, medicines for pain and inflammation, like ibuprofen or naproxen -phenytoin -procarbazine -propafenone -rasagiline -ritonavir -supplements like St. John's wort, kava kava, valerian -tramadol -tryptophan -vinblastine This list may not describe all possible interactions. Give your health care provider a list of all the medicines, herbs, non-prescription drugs, or dietary supplements you use. Also tell them if you smoke, drink alcohol, or use illegal drugs. Some items may interact with your medicine. What should I watch for while using this medicine? Tell your doctor if your symptoms do not get better or if they get worse. Visit your doctor or health care professional for regular checks on your progress. Patients and their families should watch out for new or worsening thoughts of suicide or depression. Also watch out for sudden changes in feelings such as feeling anxious, agitated, panicky, irritable, hostile, aggressive, impulsive, severely restless, overly excited and hyperactive, or not being able to sleep. If this happens, especially at the beginning of treatment or after a change in dose, call your health care professional. Dennis Bast may get drowsy or dizzy. Do not drive, use machinery, or do anything that needs mental alertness until you know how this medicine affects you. Do not stand or sit up quickly, especially if you are an older patient. This reduces the risk of dizzy or fainting spells. Alcohol may interfere with the effect of this medicine. Avoid alcoholic drinks. Your mouth may get dry. Chewing sugarless gum or sucking hard candy, and drinking plenty of water may help. Contact your doctor if the problem does not go away or is  severe. This medicine may affect blood sugar levels. If you have diabetes, check with your doctor or health care professional before you change your diet or the dose of your diabetic medicine. What side effects may I notice from receiving this medicine? Side effects that you should report to your doctor or health care professional as soon as possible: -allergic reactions like skin rash, itching or hives, swelling of the face, lips, or tongue -breathing problems -confusion -eye pain, changes in vision -fast or irregular heart rate, palpitations -flu-like fever, chills, cough, muscle or joint aches and pains -seizures -suicidal thoughts or other mood changes -swelling or redness in or around the eye -tremor -trouble sleeping -unusual bleeding or bruising -unusually tired or weak -vomiting Side effects that usually do not require medical attention (report to your doctor or health care professional if they continue or are bothersome): -change in sex drive or performance -diarrhea -dry mouth -flushing -headache -increased or decreased appetite -nausea -sweating This list may not describe all possible side effects. Call your doctor for medical advice about side effects. You may report side effects to FDA at 1-800-FDA-1088. Where should I keep my medicine? Keep out of the reach of children. Store at room temperature between 15 and 30 degrees C (59 and 86 degrees F). Throw away any unused medicine after the expiration date. NOTE: This sheet is  a summary. It may not cover all possible information. If you have questions about this medicine, talk to your doctor, pharmacist, or health care provider.    2016, Elsevier/Gold Standard. (2013-02-26 15:47:25)

## 2015-07-15 LAB — URINALYSIS W MICROSCOPIC + REFLEX CULTURE
Bacteria, UA: NONE SEEN [HPF]
Bilirubin Urine: NEGATIVE
Casts: NONE SEEN [LPF]
Crystals: NONE SEEN [HPF]
Glucose, UA: NEGATIVE
Hgb urine dipstick: NEGATIVE
Ketones, ur: NEGATIVE
Leukocytes, UA: NEGATIVE
Nitrite: NEGATIVE
Protein, ur: NEGATIVE
RBC / HPF: NONE SEEN RBC/HPF (ref <=2)
Specific Gravity, Urine: 1.005 (ref 1.001–1.035)
WBC, UA: NONE SEEN WBC/HPF (ref <=5)
Yeast: NONE SEEN [HPF]
pH: 8.5 — ABNORMAL HIGH (ref 5.0–8.0)

## 2015-07-15 LAB — PAP IG W/ RFLX HPV ASCU

## 2015-07-30 ENCOUNTER — Ambulatory Visit (INDEPENDENT_AMBULATORY_CARE_PROVIDER_SITE_OTHER): Payer: BLUE CROSS/BLUE SHIELD | Admitting: Women's Health

## 2015-07-30 ENCOUNTER — Ambulatory Visit (INDEPENDENT_AMBULATORY_CARE_PROVIDER_SITE_OTHER): Payer: BLUE CROSS/BLUE SHIELD

## 2015-07-30 ENCOUNTER — Encounter: Payer: Self-pay | Admitting: Women's Health

## 2015-07-30 VITALS — BP 124/80 | Ht 65.0 in | Wt 158.0 lb

## 2015-07-30 DIAGNOSIS — R102 Pelvic and perineal pain: Secondary | ICD-10-CM | POA: Diagnosis not present

## 2015-07-30 DIAGNOSIS — N920 Excessive and frequent menstruation with regular cycle: Secondary | ICD-10-CM

## 2015-07-30 LAB — CBC WITH DIFFERENTIAL/PLATELET
Basophils Absolute: 0 cells/uL (ref 0–200)
Basophils Relative: 0 %
Eosinophils Absolute: 270 cells/uL (ref 15–500)
Eosinophils Relative: 6 %
HCT: 40.9 % (ref 35.0–45.0)
Hemoglobin: 13.8 g/dL (ref 11.7–15.5)
Lymphocytes Relative: 24 %
Lymphs Abs: 1080 cells/uL (ref 850–3900)
MCH: 31.7 pg (ref 27.0–33.0)
MCHC: 33.7 g/dL (ref 32.0–36.0)
MCV: 93.8 fL (ref 80.0–100.0)
MPV: 9.7 fL (ref 7.5–12.5)
Monocytes Absolute: 315 cells/uL (ref 200–950)
Monocytes Relative: 7 %
Neutro Abs: 2835 cells/uL (ref 1500–7800)
Neutrophils Relative %: 63 %
Platelets: 199 10*3/uL (ref 140–400)
RBC: 4.36 MIL/uL (ref 3.80–5.10)
RDW: 13 % (ref 11.0–15.0)
WBC: 4.5 10*3/uL (ref 3.8–10.8)

## 2015-07-30 LAB — COMPREHENSIVE METABOLIC PANEL
ALT: 34 U/L — ABNORMAL HIGH (ref 6–29)
AST: 28 U/L (ref 10–30)
Albumin: 4 g/dL (ref 3.6–5.1)
Alkaline Phosphatase: 35 U/L (ref 33–115)
BUN: 8 mg/dL (ref 7–25)
CO2: 24 mmol/L (ref 20–31)
Calcium: 9 mg/dL (ref 8.6–10.2)
Chloride: 103 mmol/L (ref 98–110)
Creat: 0.53 mg/dL (ref 0.50–1.10)
Glucose, Bld: 94 mg/dL (ref 65–99)
Potassium: 4.4 mmol/L (ref 3.5–5.3)
Sodium: 138 mmol/L (ref 135–146)
Total Bilirubin: 0.6 mg/dL (ref 0.2–1.2)
Total Protein: 6.7 g/dL (ref 6.1–8.1)

## 2015-07-30 LAB — LIPID PANEL
Cholesterol: 173 mg/dL (ref 125–200)
HDL: 77 mg/dL (ref >=46)
LDL Cholesterol: 89 mg/dL (ref <130)
Total CHOL/HDL Ratio: 2.2 Ratio (ref <=5.0)
Triglycerides: 34 mg/dL (ref <150)
VLDL: 7 mg/dL (ref <30)

## 2015-07-30 NOTE — Patient Instructions (Addendum)
Abdominal Pain, Adult Many things can cause abdominal pain. Usually, abdominal pain is not caused by a disease and will improve without treatment. It can often be observed and treated at home. Your health care provider will do a physical exam and possibly order blood tests and X-rays to help determine the seriousness of your pain. However, in many cases, more time must pass before a clear cause of the pain can be found. Before that point, your health care provider may not know if you need more testing or further treatment. HOME CARE INSTRUCTIONS Monitor your abdominal pain for any changes. The following actions may help to alleviate any discomfort you are experiencing:  Only take over-the-counter or prescription medicines as directed by your health care provider.  Do not take laxatives unless directed to do so by your health care provider.  Try a clear liquid diet (broth, tea, or water) as directed by your health care provider. Slowly move to a bland diet as tolerated. SEEK MEDICAL CARE IF:  You have unexplained abdominal pain.  You have abdominal pain associated with nausea or diarrhea.  You have pain when you urinate or have a bowel movement.  You experience abdominal pain that wakes you in the night.  You have abdominal pain that is worsened or improved by eating food.  You have abdominal pain that is worsened with eating fatty foods.  You have a fever. SEEK IMMEDIATE MEDICAL CARE IF:  Your pain does not go away within 2 hours.  You keep throwing up (vomiting).  Your pain is felt only in portions of the abdomen, such as the right side or the left lower portion of the abdomen.  You pass bloody or black tarry stools. MAKE SURE YOU:  Understand these instructions.  Will watch your condition.  Will get help right away if you are not doing well or get worse.   This information is not intended to replace advice given to you by your health care provider. Make sure you discuss  any questions you have with your health care provider.   Document Released: 10/12/2004 Document Revised: 09/23/2014 Document Reviewed: 09/11/2012 Elsevier Interactive Patient Education 2016 [employer]. Levonorgestrel intrauterine device (IUD) What is this medicine? LEVONORGESTREL IUD (LEE voe nor jes trel) is a contraceptive (birth control) device. The device is placed inside the uterus by a healthcare professional. It is used to prevent pregnancy and can also be used to treat heavy bleeding that occurs during your period. Depending on the device, it can be used for 3 to 5 years. This medicine may be used for other purposes; ask your health care provider or pharmacist if you have questions. What should I tell my health care provider before I take this medicine? They need to know if you have any of these conditions: -abnormal Pap smear -cancer of the breast, uterus, or cervix -diabetes -endometritis -genital or pelvic infection now or in the past -have more than one sexual partner or your partner has more than one partner -heart disease -history of an ectopic or tubal pregnancy -immune system problems -IUD in place -liver disease or tumor -problems with blood clots or take blood-thinners -use intravenous drugs -uterus of unusual shape -vaginal bleeding that has not been explained -an unusual or allergic reaction to levonorgestrel, other hormones, silicone, or polyethylene, medicines, foods, dyes, or preservatives -pregnant or trying to get pregnant -breast-feeding How should I use this medicine? This device is placed inside the uterus by a health care professional. Talk to your pediatrician  regarding the use of this medicine in children. Special care may be needed. Overdosage: If you think you have taken too much of this medicine contact a poison control center or emergency room at once. NOTE: This medicine is only for you. Do not share this medicine with others. What if I miss a  dose? This does not apply. What may interact with this medicine? Do not take this medicine with any of the following medications: -amprenavir -bosentan -fosamprenavir This medicine may also interact with the following medications: -aprepitant -barbiturate medicines for inducing sleep or treating seizures -bexarotene -griseofulvin -medicines to treat seizures like carbamazepine, ethotoin, felbamate, oxcarbazepine, phenytoin, topiramate -modafinil -pioglitazone -rifabutin -rifampin -rifapentine -some medicines to treat HIV infection like atazanavir, indinavir, lopinavir, nelfinavir, tipranavir, ritonavir -St. John's wort -warfarin This list may not describe all possible interactions. Give your health care provider a list of all the medicines, herbs, non-prescription drugs, or dietary supplements you use. Also tell them if you smoke, drink alcohol, or use illegal drugs. Some items may interact with your medicine. What should I watch for while using this medicine? Visit your doctor or health care professional for regular check ups. See your doctor if you or your partner has sexual contact with others, becomes HIV positive, or gets a sexual transmitted disease. This product does not protect you against HIV infection (AIDS) or other sexually transmitted diseases. You can check the placement of the IUD yourself by reaching up to the top of your vagina with clean fingers to feel the threads. Do not pull on the threads. It is a good habit to check placement after each menstrual period. Call your doctor right away if you feel more of the IUD than just the threads or if you cannot feel the threads at all. The IUD may come out by itself. You may become pregnant if the device comes out. If you notice that the IUD has come out use a backup birth control method like condoms and call your health care provider. Using tampons will not change the position of the IUD and are okay to use during your  period. What side effects may I notice from receiving this medicine? Side effects that you should report to your doctor or health care professional as soon as possible: -allergic reactions like skin rash, itching or hives, swelling of the face, lips, or tongue -fever, flu-like symptoms -genital sores -high blood pressure -no menstrual period for 6 weeks during use -pain, swelling, warmth in the leg -pelvic pain or tenderness -severe or sudden headache -signs of pregnancy -stomach cramping -sudden shortness of breath -trouble with balance, talking, or walking -unusual vaginal bleeding, discharge -yellowing of the eyes or skin Side effects that usually do not require medical attention (report to your doctor or health care professional if they continue or are bothersome): -acne -breast pain -change in sex drive or performance -changes in weight -cramping, dizziness, or faintness while the device is being inserted -headache -irregular menstrual bleeding within first 3 to 6 months of use -nausea This list may not describe all possible side effects. Call your doctor for medical advice about side effects. You may report side effects to FDA at 1-800-FDA-1088. Where should I keep my medicine? This does not apply. NOTE: This sheet is a summary. It may not cover all possible information. If you have questions about this medicine, talk to your doctor, pharmacist, or health care provider.    2016, Elsevier/Gold Standard. (2011-02-02 13:54:04)

## 2015-07-30 NOTE — Progress Notes (Signed)
Patient ID: Tammy Walton, female   DOB: 05-17-1975, 40 y.o.   MRN: [id number] Presents for ultrasound. Having monthly cycles with heavy flow. Does not want to use any products with estrogen. Had a ParaGard IUD that was removed 6//2017. Mother history of uterine hyperplasia and breast cancer. Without complaint today.  Exam: Appears well. Ultrasound: T/V images. Uterus anteverted homogeneous. Endometrium within normal limits, 6.2 mm. Right left ovary normal. Negative cul-de-sac. No apparent mass right or left adnexal  Normal GYN ultrasound Menorrhagia  Plan: Options reviewed would like to proceed with Mirena IUD will check coverage have placed with next cycle per Dr. Toney Rakes

## 2015-08-02 ENCOUNTER — Telehealth: Payer: Self-pay | Admitting: Gynecology

## 2015-08-02 ENCOUNTER — Other Ambulatory Visit: Payer: Self-pay | Admitting: Gynecology

## 2015-08-02 DIAGNOSIS — R748 Abnormal levels of other serum enzymes: Secondary | ICD-10-CM

## 2015-08-02 NOTE — Telephone Encounter (Signed)
08/02/15-I spoke w/ pt to let her know her [facility] will cover the Mirena for contraception at 100%, no copay. Per [email or web address] [phone]

## 2015-08-03 ENCOUNTER — Encounter: Payer: Self-pay | Admitting: Gynecology

## 2016-03-24 ENCOUNTER — Ambulatory Visit (INDEPENDENT_AMBULATORY_CARE_PROVIDER_SITE_OTHER): Payer: BLUE CROSS/BLUE SHIELD | Admitting: Gynecology

## 2016-03-24 ENCOUNTER — Encounter: Payer: Self-pay | Admitting: Gynecology

## 2016-03-24 VITALS — BP 122/80

## 2016-03-24 DIAGNOSIS — Z30430 Encounter for insertion of intrauterine contraceptive device: Secondary | ICD-10-CM | POA: Diagnosis not present

## 2016-03-24 DIAGNOSIS — Z975 Presence of (intrauterine) contraceptive device: Secondary | ICD-10-CM | POA: Insufficient documentation

## 2016-03-24 NOTE — Patient Instructions (Signed)

## 2016-03-24 NOTE — Progress Notes (Signed)
   Patient is a 41 year old presented to the office today for placement of her Mirena IUD  She is using this for contraception as well as because of her heavy menstrual cycles. The risks benefits and pros and cons were discussed.                                                                    IUD procedure note       Patient presented to the office today for placement of Mirena IUD. The patient had previously been provided with literature information on this method of contraception. The risks benefits and pros and cons were discussed and all her questions were answered. She is fully aware that this form of contraception is 99% effective and is good for  5 years.  Pelvic exam: Bartholin urethra Skene glands: Within normal limits Vagina: No lesions or discharge Cervix: No lesions or discharge Uterus: anteverted position Adnexa: No masses or tenderness Rectal exam: Not done  The cervix was cleansed with Betadine solution. A single-tooth tenaculum was placed on the anterior cervical lip. The uterus sounded to  6-1/2 centimeter. The IUD was shown to the patient and inserted in a sterile fashion. The IUD string was trimmed. The single-tooth tenaculum was removed. Patient was instructed to return back to the office in one month for follow up.

## 2016-03-27 ENCOUNTER — Ambulatory Visit: Payer: BLUE CROSS/BLUE SHIELD | Admitting: Gynecology

## 2016-03-29 ENCOUNTER — Encounter: Payer: Self-pay | Admitting: Anesthesiology

## 2016-05-12 ENCOUNTER — Ambulatory Visit (INDEPENDENT_AMBULATORY_CARE_PROVIDER_SITE_OTHER): Payer: BLUE CROSS/BLUE SHIELD | Admitting: Gynecology

## 2016-05-12 ENCOUNTER — Encounter: Payer: Self-pay | Admitting: Gynecology

## 2016-05-12 VITALS — BP 116/74

## 2016-05-12 DIAGNOSIS — Z30431 Encounter for routine checking of intrauterine contraceptive device: Secondary | ICD-10-CM

## 2016-05-12 NOTE — Progress Notes (Signed)
   Patient is a 41 year old that presented to the office today for follow-up visit after having placed the Mirena IUD back in March 2009. Patient doing well had some spotting for the first few months has done well otherwise.  Exam: Abdomen: Soft nontender no rebound or guarding Pelvic: Bartholin urethra Skene was within normal limits Vagina: No gross lesions on inspection Cervix: The tip of the IUD string was seen at the external cervical os Bimanual exam: Uterus anteverted normal size shape and consistency and nontender Adnexa: No palpable masses or tenderness Rectal exam not done  Assessment/plan: Patient status post placement of Mirena IUD over a month ago doing well patient scheduled to return back in June or July for her annual exam and was reminded to schedule her overdue mammogram.

## 2016-05-31 ENCOUNTER — Encounter: Payer: Self-pay | Admitting: Gynecology

## 2016-06-06 ENCOUNTER — Telehealth: Payer: Self-pay | Admitting: *Deleted

## 2016-06-06 MED ORDER — ALBUTEROL SULFATE HFA 108 (90 BASE) MCG/ACT IN AERS: 2.0000 | INHALATION_SPRAY | Freq: Four times a day (QID) | RESPIRATORY_TRACT | 2 refills | Status: AC | PRN

## 2016-06-06 NOTE — Telephone Encounter (Signed)
Pt called hoping you would refill her Pro Air inhaler? No longer has PCP, I advised pt to starting looking for PCP to manage this and any other Non-Gyn issues. Pt verbalized she understood. Please advise

## 2016-06-06 NOTE — Telephone Encounter (Signed)
Pt informed, Rx sent. 

## 2016-06-06 NOTE — Telephone Encounter (Signed)
No problem call in one inhaler and 2 refills

## 2016-07-14 ENCOUNTER — Encounter: Payer: BLUE CROSS/BLUE SHIELD | Admitting: Gynecology

## 2016-07-25 ENCOUNTER — Encounter: Payer: Self-pay | Admitting: Gynecology

## 2016-07-25 ENCOUNTER — Other Ambulatory Visit: Payer: Self-pay | Admitting: Gynecology

## 2016-07-25 ENCOUNTER — Ambulatory Visit (INDEPENDENT_AMBULATORY_CARE_PROVIDER_SITE_OTHER): Payer: BLUE CROSS/BLUE SHIELD | Admitting: Gynecology

## 2016-07-25 VITALS — BP 122/80 | Ht 65.0 in | Wt 166.0 lb

## 2016-07-25 DIAGNOSIS — R635 Abnormal weight gain: Secondary | ICD-10-CM | POA: Diagnosis not present

## 2016-07-25 DIAGNOSIS — E038 Other specified hypothyroidism: Secondary | ICD-10-CM | POA: Diagnosis not present

## 2016-07-25 DIAGNOSIS — Z1231 Encounter for screening mammogram for malignant neoplasm of breast: Secondary | ICD-10-CM

## 2016-07-25 DIAGNOSIS — Z87410 Personal history of cervical dysplasia: Secondary | ICD-10-CM | POA: Diagnosis not present

## 2016-07-25 DIAGNOSIS — Z30431 Encounter for routine checking of intrauterine contraceptive device: Secondary | ICD-10-CM

## 2016-07-25 DIAGNOSIS — Z01419 Encounter for gynecological examination (general) (routine) without abnormal findings: Secondary | ICD-10-CM | POA: Diagnosis not present

## 2016-07-25 NOTE — Progress Notes (Signed)
Tammy Walton 03-Feb-1975 [id number]   History:    41 y.o.  for annual gyn exam with only complaint is of weight gain. Review of patient's record indicated that the Mirena IUD was placed in March of this year.Patient has had past history of hyperthyroidism treated with iodine-131 and subsequently developed hypothyroidism and has been followed by the endocrinologist Dr. Bubba Camp who she will be seen in the next few weeks.Patient was switched from levothyroxine throat or thyroid 120 MG daily.  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 Subsequent Pap smears have been normal.  Past medical history,surgical history, family history and social history were all reviewed and documented in the EPIC chart.  Gynecologic History Patient's last menstrual period was 06/27/2016. Contraception: IUD Last Pap: 2017. Results were: normal Last mammogram: 2015. Results were: normal  Obstetric History OB History  Gravida Para Term Preterm AB Living  5 3 3  0 2 3  SAB TAB Ectopic Multiple Live Births  2 0 0 0 3    # Outcome Date GA Lbr Len/2nd Weight Sex Delivery Anes PTL Lv  5 Term 03/22/11 [redacted]w[redacted]d 08:18 / 00:40 8 lb 0.8 oz (3.651 kg) M Vag-Spont EPI, Local  LIV     Birth Comments: caput, moulding  4 SAB 12/2009 [redacted]w[redacted]d         3 SAB 2011 [redacted]w[redacted]d            Birth Comments: chemical preg  2 Term 2008 [redacted]w[redacted]d 08:00 7 lb 11 oz (3.487 kg) F Vag-Spont EPI  LIV  1 Term 2006 [redacted]w[redacted]d 08:00 7 lb 6 oz (3.345 kg) M Vag-Spont EPI  LIV       ROS: A ROS was performed and pertinent positives and negatives are included in the history.  GENERAL: No fevers or chills. HEENT: No change in vision, no earache, sore throat or sinus congestion. NECK: No pain or stiffness. CARDIOVASCULAR: No chest pain or pressure. No palpitations. PULMONARY: No shortness of breath, cough or wheeze. GASTROINTESTINAL: No abdominal pain, nausea, vomiting or diarrhea, melena or bright red blood  per rectum. GENITOURINARY: No urinary frequency, urgency, hesitancy or dysuria. MUSCULOSKELETAL: No joint or muscle pain, no back pain, no recent trauma. DERMATOLOGIC: No rash, no itching, no lesions. ENDOCRINE: No polyuria, polydipsia, no heat or cold intolerance. No recent change in weight. HEMATOLOGICAL: No anemia or easy bruising or bleeding. NEUROLOGIC: No headache, seizures, numbness, tingling or weakness. PSYCHIATRIC: No depression, no loss of interest in normal activity or change in sleep pattern.     Exam: chaperone present  BP 122/80   Ht 5\' 5"  (1.651 m)   Wt 166 lb (75.3 kg)   LMP 06/27/2016   BMI 27.62 kg/m   Body mass index is 27.62 kg/m.  General appearance : Well developed well nourished female. No acute distress HEENT: Eyes: no retinal hemorrhage or exudates,  Neck supple, trachea midline, no carotid bruits, no thyroidmegaly Lungs: Clear to auscultation, no rhonchi or wheezes, or rib retractions  Heart: Regular rate and rhythm, no murmurs or gallops Breast:Examined in sitting and supine position were symmetrical in appearance, no palpable masses or tenderness,  no skin retraction, no nipple inversion, no nipple discharge, no skin discoloration, no axillary or supraclavicular lymphadenopathy Abdomen: no palpable masses or tenderness, no rebound or guarding Extremities: no edema or skin discoloration or tenderness  Pelvic:  Bartholin, Urethra, Skene Glands: Within normal limits  Vagina: No gross lesions or discharge  Cervix: No gross lesions or discharge, IUD string not seen  Uterus  anteverted, normal size, shape and consistency, non-tender and mobile  Adnexa  Without masses or tenderness  Anus and perineum  normal   Rectovaginal  normal sphincter tone without palpated masses or tenderness             Hemoccult not indicated     Assessment/Plan:  41 y.o. female for annual exam next week for fasting blood work as well as for the ultrasound to confirm the  IUD is in the normal position and has not changed. The following fasting blood work will be ordered: CBC, comprehensive metabolic panel, fasting lipid profile as well as a TSH that she has history of hypothyroidism. Pap smear with HPV screening done today. Patient with past history of CIN-3 LEEP cervical conization with negative margins. She was reminded to schedule her mammogram as well.   Terrance Mass MD, 11:04 AM 07/25/2016

## 2016-07-25 NOTE — Addendum Note (Signed)
Addended by: Burnett Kanaris on: 07/25/2016 11:14 AM   Modules accepted: Orders

## 2016-07-27 LAB — PAP, TP IMAGING W/ HPV RNA, RFLX HPV TYPE 16,18/45: HPV mRNA, High Risk: NOT DETECTED

## 2016-08-02 ENCOUNTER — Encounter: Payer: Self-pay | Admitting: Gynecology

## 2016-08-02 ENCOUNTER — Ambulatory Visit (INDEPENDENT_AMBULATORY_CARE_PROVIDER_SITE_OTHER): Payer: BLUE CROSS/BLUE SHIELD | Admitting: Gynecology

## 2016-08-02 ENCOUNTER — Ambulatory Visit (INDEPENDENT_AMBULATORY_CARE_PROVIDER_SITE_OTHER): Payer: BLUE CROSS/BLUE SHIELD

## 2016-08-02 VITALS — BP 118/76

## 2016-08-02 DIAGNOSIS — N83201 Unspecified ovarian cyst, right side: Secondary | ICD-10-CM | POA: Insufficient documentation

## 2016-08-02 DIAGNOSIS — Z30431 Encounter for routine checking of intrauterine contraceptive device: Secondary | ICD-10-CM | POA: Diagnosis not present

## 2016-08-02 LAB — LIPID PANEL
Cholesterol: 171 mg/dL (ref <200)
HDL: 68 mg/dL (ref >50)
LDL Cholesterol: 95 mg/dL (ref <100)
Total CHOL/HDL Ratio: 2.5 Ratio (ref <5.0)
Triglycerides: 41 mg/dL (ref <150)
VLDL: 8 mg/dL (ref <30)

## 2016-08-02 LAB — CBC WITH DIFFERENTIAL/PLATELET
Basophils Absolute: 42 cells/uL (ref 0–200)
Basophils Relative: 1 %
Eosinophils Absolute: 210 cells/uL (ref 15–500)
Eosinophils Relative: 5 %
HCT: 42.2 % (ref 35.0–45.0)
Hemoglobin: 14 g/dL (ref 11.7–15.5)
Lymphocytes Relative: 23 %
Lymphs Abs: 966 cells/uL (ref 850–3900)
MCH: 32.3 pg (ref 27.0–33.0)
MCHC: 33.2 g/dL (ref 32.0–36.0)
MCV: 97.5 fL (ref 80.0–100.0)
MPV: 9.8 fL (ref 7.5–12.5)
Monocytes Absolute: 294 cells/uL (ref 200–950)
Monocytes Relative: 7 %
Neutro Abs: 2688 cells/uL (ref 1500–7800)
Neutrophils Relative %: 64 %
Platelets: 220 10*3/uL (ref 140–400)
RBC: 4.33 MIL/uL (ref 3.80–5.10)
RDW: 13.2 % (ref 11.0–15.0)
WBC: 4.2 10*3/uL (ref 3.8–10.8)

## 2016-08-02 LAB — COMPREHENSIVE METABOLIC PANEL
ALT: 28 U/L (ref 6–29)
AST: 26 U/L (ref 10–30)
Albumin: 4.3 g/dL (ref 3.6–5.1)
Alkaline Phosphatase: 39 U/L (ref 33–115)
BUN: 7 mg/dL (ref 7–25)
CO2: 23 mmol/L (ref 20–31)
Calcium: 9.1 mg/dL (ref 8.6–10.2)
Chloride: 104 mmol/L (ref 98–110)
Creat: 0.59 mg/dL (ref 0.50–1.10)
Glucose, Bld: 96 mg/dL (ref 65–99)
Potassium: 4.4 mmol/L (ref 3.5–5.3)
Sodium: 139 mmol/L (ref 135–146)
Total Bilirubin: 0.6 mg/dL (ref 0.2–1.2)
Total Protein: 6.8 g/dL (ref 6.1–8.1)

## 2016-08-02 LAB — TSH: TSH: 1.05 mIU/L

## 2016-08-02 NOTE — Patient Instructions (Signed)
CA-125 Tumor Marker Test Why am I having this test? This test is used to check the level of cancer antigen 125 (CA-125) in your blood. The CA-125 tumor marker test can be helpful in detecting ovarian cancer. The test is only performed if you are considered at high risk for ovarian cancer. Your health care provider may recommend this test if:  You have a strong family history of ovarian cancer.  You have a breast cancer antigen (BRCA) genetic defect.  If you have already been diagnosed with ovarian cancer, your health care provider may use this test to help identify the extent of the disease and to monitor your response to treatment. What kind of sample is taken? A blood sample is required for this test. It is usually collected by inserting a needle into a vein. How do I prepare for this test? There is no preparation required for this test. What are the reference ranges? Reference ranges are considered healthy ranges established after testing a large group of healthy people. Reference ranges may vary among different people, labs, and hospitals. It is your responsibility to obtain your test results. Ask the lab or department performing the test when and how you will get your results. The reference range for this test is 0-35 units/mL or less than 35 kunits/L (SI units). What do the results mean? Increased levels of CA-125 may indicate:  Certain types of cancer, including: ? Ovarian cancer. ? Pancreatic cancer. ? Colon cancer. ? Lung cancer. ? Breast cancer. ? Lymphoma.  Noncancerous (benign) disorders, including: ? Cirrhosis. ? Pregnancy. ? Endometriosis. ? Pancreatitis. ? Pelvic inflammatory disease (PID).  Talk with your health care provider to discuss your results, treatment options, and if necessary, the need for more tests. Talk with your health care provider if you have any questions about your results. Talk with your health care provider to discuss your results, treatment  options, and if necessary, the need for more tests. Talk with your health care provider if you have any questions about your results. This information is not intended to replace advice given to you by your health care provider. Make sure you discuss any questions you have with your health care provider. Document Released: 01/25/2004 Document Revised: 09/07/2015 Document Reviewed: 05/22/2013 Elsevier Interactive Patient Education  2018 Elsevier Inc. Ovarian Cyst An ovarian cyst is a fluid-filled sac that forms on an ovary. The ovaries are small organs that produce eggs in women. Various types of cysts can form on the ovaries. Some may cause symptoms and require treatment. Most ovarian cysts go away on their own, are not cancerous (are benign), and do not cause problems. Common types of ovarian cysts include:  Functional (follicle) cysts. ? Occur during the menstrual cycle, and usually go away with the next menstrual cycle if you do not get pregnant. ? Usually cause no symptoms.  Endometriomas. ? Are cysts that form from the tissue that lines the uterus (endometrium). ? Are sometimes called "chocolate cysts" because they become filled with blood that turns brown. ? Can cause pain in the lower abdomen during intercourse and during your period.  Cystadenoma cysts. ? Develop from cells on the outside surface of the ovary. ? Can get very large and cause lower abdomen pain and pain with intercourse. ? Can cause severe pain if they twist or break open (rupture).  Dermoid cysts. ? Are sometimes found in both ovaries. ? May contain different kinds of body tissue, such as skin, teeth, hair, or cartilage. ? Usually do   not cause symptoms unless they get very big.  Theca lutein cysts. ? Occur when too much of a certain hormone (human chorionic gonadotropin) is produced and overstimulates the ovaries to produce an egg. ? Are most common after having procedures used to assist with the conception of a  baby (in vitro fertilization).  What are the causes? Ovarian cysts may be caused by:  Ovarian hyperstimulation syndrome. This is a condition that can develop from taking fertility medicines. It causes multiple large ovarian cysts to form.  Polycystic ovarian syndrome (PCOS). This is a common hormonal disorder that can cause ovarian cysts, as well as problems with your period or fertility.  What increases the risk? The following factors may make you more likely to develop ovarian cysts:  Being overweight or obese.  Taking fertility medicines.  Taking certain forms of hormonal birth control.  Smoking.  What are the signs or symptoms? Many ovarian cysts do not cause symptoms. If symptoms are present, they may include:  Pelvic pain or pressure.  Pain in the lower abdomen.  Pain during sex.  Abdominal swelling.  Abnormal menstrual periods.  Increasing pain with menstrual periods.  How is this diagnosed? These cysts are commonly found during a routine pelvic exam. You may have tests to find out more about the cyst, such as:  Ultrasound.  X-ray of the pelvis.  CT scan.  MRI.  Blood tests.  How is this treated? Many ovarian cysts go away on their own without treatment. Your health care provider may want to check your cyst regularly for 2-3 months to see if it changes. If you are in menopause, it is especially important to have your cyst monitored closely because menopausal women have a higher rate of ovarian cancer. When treatment is needed, it may include:  Medicines to help relieve pain.  A procedure to drain the cyst (aspiration).  Surgery to remove the whole cyst.  Hormone treatment or birth control pills. These methods are sometimes used to help dissolve a cyst.  Follow these instructions at home:  Take over-the-counter and prescription medicines only as told by your health care provider.  Do not drive or use heavy machinery while taking prescription pain  medicine.  Get regular pelvic exams and Pap tests as often as told by your health care provider.  Return to your normal activities as told by your health care provider. Ask your health care provider what activities are safe for you.  Do not use any products that contain nicotine or tobacco, such as cigarettes and e-cigarettes. If you need help quitting, ask your health care provider.  Keep all follow-up visits as told by your health care provider. This is important. Contact a health care provider if:  Your periods are late, irregular, or painful, or they stop.  You have pelvic pain that does not go away.  You have pressure on your bladder or trouble emptying your bladder completely.  You have pain during sex.  You have any of the following in your abdomen: ? A feeling of fullness. ? Pressure. ? Discomfort. ? Pain that does not go away. ? Swelling.  You feel generally ill.  You become constipated.  You lose your appetite.  You develop severe acne.  You start to have more body hair and facial hair.  You are gaining weight or losing weight without changing your exercise and eating habits.  You think you may be pregnant. Get help right away if:  You have abdominal pain that is severe   or gets worse.  You cannot eat or drink without vomiting.  You suddenly develop a fever.  Your menstrual period is much heavier than usual. This information is not intended to replace advice given to you by your health care provider. Make sure you discuss any questions you have with your health care provider. Document Released: 01/02/2005 Document Revised: 07/23/2015 Document Reviewed: 06/06/2015 Elsevier Interactive Patient Education  2018 Elsevier Inc.  

## 2016-08-02 NOTE — Progress Notes (Signed)
   Patient is a 41 year old that presented to the office today for an ultrasound due to the fact that at time of her annual exam on July 10 the Mirena IUD string was not seen. She had had the Mirena IUD placed in March of this year had done well otherwise. See previous notes for detail. Patient is also fasting today for her screening annual blood work. Her recent Pap smear was normal. 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  Ultrasound today: Uterus measured 9.4 x 6.9 x 4.8 cm with endometrial stripe of 4.5 mm. IUD seen in the endometrial cavity. Left ovary was normal. There was a thinwall avascular right ovarian cyst measuring 4.5 x 3.0 x 4.1 cm there was no fluid seen in the cul-de-sac.  Assessment/plan: Incidental finding of right ovarian cyst with benign features we'll do a CA 125 today in follow-up in 3 months. She was given the option also given an injection of Depo-Provera 150 mg IM and she preferred to wait and follow up with the ultrasound in 3 months. Literature information on CA 125 and on ovarian cyst was provided. Limitations of CA 125 were discussed as well.  Greater than 90% time was spent counseling and coordinating care for this patient. Time of consultation 15 minutes

## 2016-08-03 ENCOUNTER — Encounter: Payer: Self-pay | Admitting: Gynecology

## 2016-08-03 LAB — CA 125: CA 125: 13 U/mL (ref <35)

## 2016-08-22 ENCOUNTER — Ambulatory Visit: Payer: Self-pay

## 2016-08-29 ENCOUNTER — Ambulatory Visit
Admission: RE | Admit: 2016-08-29 | Discharge: 2016-08-29 | Disposition: A | Discharge status: Home / Self Care | Payer: BLUE CROSS/BLUE SHIELD | Source: Ambulatory Visit | Attending: Gynecology | Admitting: Gynecology

## 2016-08-29 DIAGNOSIS — Z1231 Encounter for screening mammogram for malignant neoplasm of breast: Secondary | ICD-10-CM

## 2016-08-31 ENCOUNTER — Other Ambulatory Visit: Payer: Self-pay | Admitting: Gynecology

## 2016-08-31 DIAGNOSIS — R928 Other abnormal and inconclusive findings on diagnostic imaging of breast: Secondary | ICD-10-CM

## 2016-09-01 ENCOUNTER — Ambulatory Visit
Admission: RE | Admit: 2016-09-01 | Discharge: 2016-09-01 | Disposition: A | Discharge status: Home / Self Care | Payer: BLUE CROSS/BLUE SHIELD | Source: Ambulatory Visit | Attending: Gynecology | Admitting: Gynecology

## 2016-09-01 ENCOUNTER — Other Ambulatory Visit: Payer: Self-pay | Admitting: Gynecology

## 2016-09-01 DIAGNOSIS — R928 Other abnormal and inconclusive findings on diagnostic imaging of breast: Secondary | ICD-10-CM

## 2016-09-01 DIAGNOSIS — N6320 Unspecified lump in the left breast, unspecified quadrant: Secondary | ICD-10-CM

## 2016-09-04 ENCOUNTER — Ambulatory Visit
Admission: RE | Admit: 2016-09-04 | Discharge: 2016-09-04 | Disposition: A | Discharge status: Home / Self Care | Payer: BLUE CROSS/BLUE SHIELD | Source: Ambulatory Visit | Attending: Gynecology | Admitting: Gynecology

## 2016-09-04 ENCOUNTER — Other Ambulatory Visit: Payer: Self-pay | Admitting: Gynecology

## 2016-09-04 DIAGNOSIS — N6320 Unspecified lump in the left breast, unspecified quadrant: Secondary | ICD-10-CM

## 2016-09-04 DIAGNOSIS — N6002 Solitary cyst of left breast: Secondary | ICD-10-CM

## 2016-09-05 ENCOUNTER — Other Ambulatory Visit: Payer: BLUE CROSS/BLUE SHIELD

## 2016-11-01 ENCOUNTER — Ambulatory Visit: Payer: BLUE CROSS/BLUE SHIELD | Admitting: Obstetrics & Gynecology

## 2016-11-01 ENCOUNTER — Other Ambulatory Visit: Payer: BLUE CROSS/BLUE SHIELD

## 2016-11-01 ENCOUNTER — Other Ambulatory Visit: Payer: Self-pay | Admitting: Obstetrics & Gynecology

## 2016-11-01 DIAGNOSIS — Z0289 Encounter for other administrative examinations: Secondary | ICD-10-CM

## 2016-11-01 DIAGNOSIS — N83201 Unspecified ovarian cyst, right side: Secondary | ICD-10-CM

## 2016-12-06 ENCOUNTER — Ambulatory Visit (INDEPENDENT_AMBULATORY_CARE_PROVIDER_SITE_OTHER): Payer: BLUE CROSS/BLUE SHIELD | Admitting: Obstetrics & Gynecology

## 2016-12-06 ENCOUNTER — Encounter: Payer: Self-pay | Admitting: Obstetrics & Gynecology

## 2016-12-06 ENCOUNTER — Ambulatory Visit (INDEPENDENT_AMBULATORY_CARE_PROVIDER_SITE_OTHER): Payer: BLUE CROSS/BLUE SHIELD

## 2016-12-06 VITALS — BP 126/84

## 2016-12-06 DIAGNOSIS — N83201 Unspecified ovarian cyst, right side: Secondary | ICD-10-CM

## 2016-12-06 DIAGNOSIS — R635 Abnormal weight gain: Secondary | ICD-10-CM | POA: Diagnosis not present

## 2016-12-06 DIAGNOSIS — Z975 Presence of (intrauterine) contraceptive device: Secondary | ICD-10-CM

## 2016-12-06 DIAGNOSIS — Z9189 Other specified personal risk factors, not elsewhere classified: Secondary | ICD-10-CM

## 2016-12-06 DIAGNOSIS — Z30432 Encounter for removal of intrauterine contraceptive device: Secondary | ICD-10-CM | POA: Diagnosis not present

## 2016-12-06 NOTE — Progress Notes (Signed)
    Tammy Walton 10-04-1975 [id number]        41 y.o.  [id number] Married.  Vasectomy.  RP:  Pelvic US for f/u on Ovarian Cyst  HPI:  Concerned about weight gain, about 11 Lbs since Mirena IUD insertion.  Seen by Endocrino, TSH mildly low, so rather borderline over treated with Armour Thyroid.  No pelvic pain.  No abnormal vaginal bleeding.  Would like to remove the Mirena IUD.  Past medical history,surgical history, problem list, medications, allergies, family history and social history were all reviewed and documented in the EPIC chart.  Directed ROS with pertinent positives and negatives documented in the history of present illness/assessment and plan.  Exam:  Vitals:   12/06/16 1542  BP: 126/84   General appearance:  Normal  Pelvic US today:  T/V uterus anteverted and homogeneous measuring 10.05 x 7.57 x 5.48 cm.  IUD seen in normal intrauterine position.  Endometrial lining within normal at 5.0 mm.  Right and left ovaries normal.  Previous right ovarian cyst not seen.  No apparent mass in the right and left adnexa.  No free fluid in posterior cul-de-sac.  Gyn exam:  Vulva normal.  Speculum:  IUD strings not visible.  Betadine prep/Hurricane spray on cervix.  Mirena IUD removed by pulling on strings with a Boseman clamp in the endocervical canal.  Complete/Intact IUD shown to patient before discarding.  Normal cervix/vagina.   Assessment/Plan:  41 y.o. [id number]   1. Cyst of right ovary Resolved right ovarian cyst.  Normal pelvic ultrasound today.  Patient reassured.  2. Weight gain 11 pound weight gain since March 2018.  Patient attributes the weight gain to the progesterone and the Marinol IUD.  Desires removal of the Mirena IUD.  Low calorie-low-carb diet discussed.  Recommended aerobic physical activity 5 times a week and weightlifting every 2 days.  3. IUD contraception Patient was using Marinol IUD for cycle control.  Husband has a vasectomy.  Requests removal of the  Mirena IUD because of weight gain.  4. Encounter for IUD removal Strings were not visible but nonetheless easy removal of the Mirena IUD.  IUD was complete and intact.  5. Relies on partner vasectomy for contraception  Counseling on above issues >50% x 25 minutes.  Princess Bruins MD, 4:07 PM 12/06/2016

## 2016-12-09 ENCOUNTER — Encounter: Payer: Self-pay | Admitting: Obstetrics & Gynecology

## 2016-12-09 NOTE — Patient Instructions (Signed)
1. Cyst of right ovary Resolved right ovarian cyst.  Normal pelvic ultrasound today.  Patient reassured.  2. Weight gain 11 pound weight gain since March 2018.  Patient attributes the weight gain to the progesterone and the Marinol IUD.  Desires removal of the Mirena IUD.  Low calorie-low-carb diet discussed.  Recommended aerobic physical activity 5 times a week and weightlifting every 2 days.  3. IUD contraception Patient was using Marinol IUD for cycle control.  Husband has a vasectomy.  Requests removal of the Mirena IUD because of weight gain.  4. Encounter for IUD removal Strings were not visible but nonetheless easy removal of the Mirena IUD.  IUD was complete and intact.  5. Relies on partner vasectomy for contraception  Gilmore Laroche, it was a pleasure meeting you today!  I will see you again at your annual exam in July 2019 or before if you have problems.

## 2017-07-26 ENCOUNTER — Encounter: Payer: BLUE CROSS/BLUE SHIELD | Admitting: Obstetrics & Gynecology

## 2018-10-08 ENCOUNTER — Encounter: Payer: Self-pay | Admitting: Gynecology

## 2019-02-04 ENCOUNTER — Other Ambulatory Visit: Payer: BLUE CROSS/BLUE SHIELD

## 2019-02-28 ENCOUNTER — Other Ambulatory Visit: Payer: Self-pay | Admitting: Obstetrics & Gynecology

## 2019-02-28 DIAGNOSIS — Z1231 Encounter for screening mammogram for malignant neoplasm of breast: Secondary | ICD-10-CM

## 2019-04-10 ENCOUNTER — Ambulatory Visit
Admission: RE | Admit: 2019-04-10 | Discharge: 2019-04-10 | Disposition: A | Discharge status: Home / Self Care | Payer: BC Managed Care – PPO | Source: Ambulatory Visit | Attending: Obstetrics & Gynecology | Admitting: Obstetrics & Gynecology

## 2019-04-10 ENCOUNTER — Other Ambulatory Visit: Payer: Self-pay

## 2019-04-10 DIAGNOSIS — Z1231 Encounter for screening mammogram for malignant neoplasm of breast: Secondary | ICD-10-CM

## 2020-04-12 ENCOUNTER — Other Ambulatory Visit: Payer: Self-pay | Admitting: Obstetrics & Gynecology

## 2020-04-12 DIAGNOSIS — Z1231 Encounter for screening mammogram for malignant neoplasm of breast: Secondary | ICD-10-CM

## 2020-06-21 ENCOUNTER — Ambulatory Visit
Admission: RE | Admit: 2020-06-21 | Discharge: 2020-06-21 | Disposition: A | Discharge status: Home / Self Care | Payer: 59 | Source: Ambulatory Visit | Attending: Obstetrics & Gynecology | Admitting: Obstetrics & Gynecology

## 2020-06-21 ENCOUNTER — Other Ambulatory Visit: Payer: Self-pay

## 2020-06-21 DIAGNOSIS — Z1231 Encounter for screening mammogram for malignant neoplasm of breast: Secondary | ICD-10-CM

## 2021-06-21 ENCOUNTER — Ambulatory Visit: Payer: 59 | Admitting: Obstetrics & Gynecology

## 2021-06-21 ENCOUNTER — Encounter: Payer: Self-pay | Admitting: Obstetrics & Gynecology

## 2021-06-21 VITALS — BP 114/80 | HR 102 | Resp 16 | Ht 64.5 in | Wt 203.0 lb

## 2021-06-21 DIAGNOSIS — E039 Hypothyroidism, unspecified: Secondary | ICD-10-CM

## 2021-06-21 DIAGNOSIS — N921 Excessive and frequent menstruation with irregular cycle: Secondary | ICD-10-CM

## 2021-06-21 MED ORDER — MEGESTROL ACETATE 40 MG PO TABS
40.0000 mg | ORAL_TABLET | Freq: Two times a day (BID) | ORAL | 2 refills | Status: AC
Start: 2021-06-21 — End: ?

## 2021-06-21 NOTE — Progress Notes (Signed)
Tammy Walton 1975/08/25 [id number]        46 y.o.  [id number] Husband vasectomized.  RP: Oligo-menorrhagia   HPI: Heavy menstrual period with blood clots currently for 4 days.  Usually having rather heavy periods every month, but skipped x a month this time and now the flow is heavier than usual.  Mirena IUD in the past to control the flow, removed in 2018.  Mother with h/o Endometrial Hyperplasia.  No pelvic pain.  No pain with IC.  Husband vasectomized.  Hypothyroidism on Synthroid, dosage mildly increased 2 months ago.   OB History  Gravida Para Term Preterm AB Living  '5 3 3 '$ 0 2 3  SAB IAB Ectopic Multiple Live Births  2 0 0 0 3    # Outcome Date GA Lbr Len/2nd Weight Sex Delivery Anes PTL Lv  5 Term 03/22/11 [id number] / 00:40 8 lb 0.8 oz (3.651 kg) M Vag-Spont EPI, Local  LIV     Birth Comments: caput, moulding  4 SAB 12/2009 [id number]       3 SAB 2011 [id number]         Birth Comments: chemical preg  2 Term 2008 [id number] 7 lb 11 oz (3.487 kg) F Vag-Spont EPI  LIV  1 Term 2006 [id number] 7 lb 6 oz (3.345 kg) M Vag-Spont EPI  LIV    Past medical history,surgical history, problem list, medications, allergies, family history and social history were all reviewed and documented in the EPIC chart.   Directed ROS with pertinent positives and negatives documented in the history of present illness/assessment and plan.  Exam:  Vitals:   06/21/21 1150  BP: 114/80  Pulse: (!) 102  Resp: 16  Weight: 203 lb (92.1 kg)  Height: 5' 4.5" (1.638 m)   General appearance:  Normal  Abdomen: Normal  Gynecologic exam: Vulva normal.  Bimanual exam:  Uterus AV, mobile, normal volume, NT.  Moderate dark blood, no blood clot at this time.  No adnexal mass, NT.     Assessment/Plan:  45 y.82 [id number] Menorrhagia with irregular cycle Heavy menstrual period with blood clots currently for 4 days.  Usually having rather heavy periods every month, but skipped x a month this time and  now the flow is heavier than usual.  Mirena IUD in the past to control the flow, removed in 2018.  Mother with h/o Endometrial Hyperplasia.  No pelvic pain.  No pain with IC.  Husband vasectomized.  Hypothyroidism on Synthroid, dosage mildly increased 2 months ago.  Normal gynecologic exam with moderate menstrual flow currently.  R/O Anemia and under controled Hypothyroidism.  [place]  Control menstrual flow with Megace in the meantime. No CI.  Megace 40 mg PO BID.  Usage reviewed.  Prescription sent to pharmacy. - CBC - TSH - US Tr[place]vaginal Non-OB; Future  2. Acquired hypothyroidism Synthroid mildly increased 2 months ago.  Recheck TSH today. - TSH  Other orders - clonazePAM (KLONOPIN) 1 MG tablet; SMARTSIG:0.5-1 Tablet(s) By Mouth 1-2 Times Daily PRN - FLUoxetine (PROZAC) 20 MG capsule; Take 20 mg by mouth 2 (two) times daily. - SYNTHROID 200 MCG tablet; Take 200 mcg by mouth 2 (two) times a week. - levothyroxine (SYNTHROID) 175 MCG tablet; Take 175 mcg by mouth. 5 days a week - megestrol (MEGACE) 40 MG tablet; Take 1 tablet (40 mg total) by mouth 2 (  two) times daily.   Counseling on heavy irregular menses, perimenopause, endometrial pathology for 30 minutes.  Princess Bruins MD, 12:18 PM 06/21/2021

## 2021-06-22 ENCOUNTER — Encounter: Payer: Self-pay | Admitting: Obstetrics & Gynecology

## 2021-06-22 LAB — CBC
HCT: 37.5 % (ref 35.0–45.0)
Hemoglobin: 13.1 g/dL (ref 11.7–15.5)
MCH: 33.3 pg — ABNORMAL HIGH (ref 27.0–33.0)
MCHC: 34.9 g/dL (ref 32.0–36.0)
MCV: 95.4 fL (ref 80.0–100.0)
MPV: 9.9 fL (ref 7.5–12.5)
Platelets: 248 10*3/uL (ref 140–400)
RBC: 3.93 10*6/uL (ref 3.80–5.10)
RDW: 12.3 % (ref 11.0–15.0)
WBC: 6 10*3/uL (ref 3.8–10.8)

## 2021-06-22 LAB — TSH: TSH: 9.72 mIU/L — ABNORMAL HIGH

## 2021-08-11 ENCOUNTER — Other Ambulatory Visit: Payer: 59

## 2021-08-11 ENCOUNTER — Other Ambulatory Visit: Payer: 59 | Admitting: Obstetrics & Gynecology

## 2021-09-07 ENCOUNTER — Other Ambulatory Visit: Payer: Self-pay | Admitting: Obstetrics & Gynecology

## 2021-09-07 ENCOUNTER — Ambulatory Visit (INDEPENDENT_AMBULATORY_CARE_PROVIDER_SITE_OTHER): Payer: 59

## 2021-09-07 ENCOUNTER — Encounter: Payer: Self-pay | Admitting: Obstetrics & Gynecology

## 2021-09-07 ENCOUNTER — Ambulatory Visit (INDEPENDENT_AMBULATORY_CARE_PROVIDER_SITE_OTHER): Payer: 59 | Admitting: Obstetrics & Gynecology

## 2021-09-07 VITALS — BP 118/82 | HR 104

## 2021-09-07 DIAGNOSIS — N921 Excessive and frequent menstruation with irregular cycle: Secondary | ICD-10-CM

## 2021-09-07 DIAGNOSIS — D219 Benign neoplasm of connective and other soft tissue, unspecified: Secondary | ICD-10-CM

## 2021-09-07 NOTE — Progress Notes (Signed)
Tammy Walton March 29, 1975 [id number]        46 y.o.  [id number] Husband vasectomized  RP: Menometrorrhagia for pelvic US  HPI: Heavy menstrual periods with blood clots every 1-2 months.  Some BTB now since last visit in 06/2021.  Has used Megace with her menses to reduce the flow. No pelvic pain. Mirena IUD in the past to control the flow, removed in 2018.  Mother with h/o Endometrial Hyperplasia.  No pelvic pain.  No pain with IC.  Husband vasectomized.  Hypothyroidism on Synthroid, dosage mildly increased in 04/2021.   OB History  Gravida Para Term Preterm AB Living  '5 3 3 '$ 0 2 3  SAB IAB Ectopic Multiple Live Births  2 0 0 0 3    # Outcome Date GA Lbr Len/2nd Weight Sex Delivery Anes PTL Lv  5 Term 03/22/11 24w0d08:18 / 00:40 8 lb 0.8 oz (3.651 kg) M Vag-Spont EPI, Local  LIV     Birth Comments: caput, moulding  4 SAB 12/2009 [redacted]w[redacted]d       3 SAB 2011 5w69w0d         Birth Comments: chemical preg  2 Term 2008 [id number] 7 lb 11 oz (3.487 kg) F Vag-Spont EPI  LIV  1 Term 2006 39w045w0d0 7 lb 6 oz (3.345 kg) M Vag-Spont EPI  LIV    Past medical history,surgical history, problem list, medications, allergies, family history and social history were all reviewed and documented in the EPIC chart.   Directed ROS with pertinent positives and negatives documented in the history of present illness/assessment and plan.  Exam:  Vitals:   09/07/21 1633  BP: 118/82  Pulse: (!) 104  SpO2: 99%   General appearance:  Normal  [place] Comparison is made with previous scan in 2018 (old machine).  T/a and T/V images.  Anteverted slightly enlarged uterus no significant change in size since the previous scan but several intramural fibroids are present which were not seen previously.  The overall uterine size is measured at 8.41 x 7.08 x 5.45 cm.  4 fibroids were measured from 1.2 cm to 2.2 cm.  The endometrial lining is symmetrical measured at 4.8 mm with no thickening, no mass or  abnormal blood flow seen.  The right ovary is small with no visible follicles.  The left ovary is normal in size with a single follicle measured at 1.9 x 1.5 cm.  No adnexal mass.  No free fluid in the pelvis.  Informed consent signed for Endometrial Biopsy:  Speculum:  Cervix/vagina normal.  Mild dark menstrual blood is present in the vagina and at the EO of the cervix.  Betadine prep.  Hurricane spray.  EBx pipette inserted easily in the IU cavity.  Endometrial biopsy with suction on all IU surfaces.  Good specimen obtained.  Specimen sent to pathology.  Speculum removed.  Procedure well tolerated without Cx.      Assessment/Plan:  45 y.50 [id number] Menorrhagia with irregular cycle Heavy menstrual periods with blood clots every 1-2 months.  Some BTB now since last visit in 06/2021.  Has used Megace with her menses to reduce the flow. No pelvic pain. Mirena IUD in the past to control the flow, removed in 2018.  Mother with h/o Endometrial Hyperplasia.  No pelvic pain.  No pain with IC.  Husband vasectomized.  Hypothyroidism on Synthroid, dosage mildly increased in 04/2021.  Pelvic [place] Fibroids.  Endometrial line 4.8 mm.  EBx done, pending results.  If benign, will f/u for Mirena IUD insertion to control her menstrual periods.   - US Transvaginal Non-OB; Future - IUD Insertion; Future  2. Fibroids  New Dx of Uterine Fibroids, but uterine size stable c/w [place] in 2018.  Will f/u Pelvic US in 6 months to reassess the Fibroids. - US Transvaginal Non-OB; Future   Princess Bruins MD, 4:48 PM 09/07/2021

## 2021-09-08 ENCOUNTER — Other Ambulatory Visit (HOSPITAL_COMMUNITY)
Admission: RE | Admit: 2021-09-08 | Discharge: 2021-09-08 | Disposition: A | Discharge status: Home / Self Care | Payer: 59 | Source: Ambulatory Visit | Attending: Obstetrics & Gynecology | Admitting: Obstetrics & Gynecology

## 2021-09-08 DIAGNOSIS — N921 Excessive and frequent menstruation with irregular cycle: Secondary | ICD-10-CM | POA: Insufficient documentation

## 2021-09-08 NOTE — Addendum Note (Signed)
Addended by: Princess Bruins on: 09/08/2021 09:34 AM   Modules accepted: Orders

## 2021-09-12 LAB — SURGICAL PATHOLOGY

## 2021-10-31 ENCOUNTER — Encounter: Payer: Self-pay | Admitting: Obstetrics & Gynecology

## 2021-10-31 ENCOUNTER — Ambulatory Visit (INDEPENDENT_AMBULATORY_CARE_PROVIDER_SITE_OTHER): Payer: 59 | Admitting: Obstetrics & Gynecology

## 2021-10-31 VITALS — BP 118/80

## 2021-10-31 DIAGNOSIS — N921 Excessive and frequent menstruation with irregular cycle: Secondary | ICD-10-CM | POA: Diagnosis not present

## 2021-10-31 DIAGNOSIS — D219 Benign neoplasm of connective and other soft tissue, unspecified: Secondary | ICD-10-CM

## 2021-10-31 DIAGNOSIS — Z30430 Encounter for insertion of intrauterine contraceptive device: Secondary | ICD-10-CM

## 2021-10-31 NOTE — Progress Notes (Signed)
Tammy Walton 1975/12/19 [id number]        46 y.o.  [id number] Husband vasectomized.  RP: Mirena IUD insertion   HPI: Menometrorrhagia with improved bleeding on Megace 20 mg daily currently.  No pelvic pain.  Known Uterine Fibroids.  Previous Mirena IUD for cycle control, removed in 2018.  Pelvic US 09/07/21: Comparison is made with previous scan in 2018 (old machine).  T/a and T/V images.  Anteverted slightly enlarged uterus no significant change in size since the previous scan but several intramural fibroids are present which were not seen previously.  The overall uterine size is measured at 8.41 x 7.08 x 5.45 cm.  4 fibroids were measured from 1.2 cm to 2.2 cm.  The endometrial lining is symmetrical measured at 4.8 mm with no thickening, no mass or abnormal blood flow seen.  The right ovary is small with no visible follicles.  The left ovary is normal in size with a single follicle measured at 1.9 x 1.5 cm.  No adnexal mass.  No free fluid in the pelvis.   EBX 09/07/21 FINAL MICROSCOPIC DIAGNOSIS:   A. ENDOMETRIUM, BIOPSY:  - Scant fragments of benign endometrial polyp  - Benign inactive endometrium with hormone effect  - Negative for hyperplasia or malignancy   OB History  Gravida Para Term Preterm AB Living  '5 3 3 '$ 0 2 3  SAB IAB Ectopic Multiple Live Births  2 0 0 0 3    # Outcome Date GA Lbr Len/2nd Weight Sex Delivery Anes PTL Lv  5 Term 03/22/11 73w0d08:18 / 00:40 8 lb 0.8 oz (3.651 kg) M Vag-Spont EPI, Local  LIV     Birth Comments: caput, moulding  4 SAB 12/2009 662w0d       3 SAB 2011 5w24w0d         Birth Comments: chemical preg  2 Term 2008 39w62w0d00 7 lb 11 oz (3.487 kg) F Vag-Spont EPI  LIV  1 Term 2006 39w073w0d0 7 lb 6 oz (3.345 kg) M Vag-Spont EPI  LIV    Past medical history,surgical history, problem list, medications, allergies, family history and social history were all reviewed and documented in the EPIC chart.   Directed ROS with pertinent  positives and negatives documented in the history of present illness/assessment and plan.  Exam:  Vitals:   10/31/21 1507  BP: 118/80   General appearance:  Normal  Mirena IUD procedure note       Patient presented to the office today for placement of Mirena IUD. The patient had previously been provided with literature information on this method of contraception. The risks benefits and pros and cons were discussed and all her questions were answered. She is fully aware that this form of contraception is 99% effective and is good for 8 years.  Pelvic exam: Vulva normal Vagina: No lesions or discharge Cervix: No lesions or discharge Uterus: AV to intermediate position Adnexa: No masses or tenderness Rectal exam: Not done  The cervix was cleansed with Betadine solution. Hurricane spray on the cervix.  A single-tooth tenaculum was placed on the anterior cervical lip. Os finder hysterometry at 7 cm. The IUD was shown to the patient and inserted in a sterile fashion. The IUD string was trimmed. The single-tooth tenaculum was removed. Patient was instructed to return back to the office in one month for follow up.         Assessment/Plan:  46 y.8  [id number]   1. Encounter for IUD insertion Menometrorrhagia with improved bleeding on Megace 20 mg daily currently.  No pelvic pain.  Known Uterine Fibroids.  Previous Mirena IUD for cycle control, removed in 2018.  Successful Mirena IUD insertion.  Well tolerated, no Cx.  Post procedure precautions.  F/U in 4 weeks for IUD check.  Will wean and stop Megace.  2. Menorrhagia with irregular cycle As above.  EBx benign 08/2021. - IUD Insertion  3. Fibroids  Anteverted slightly enlarged uterus no significant change in size since the previous scan but several intramural fibroids are present which were not seen previously.  The overall uterine size is measured at 8.41 x 7.08 x 5.45 cm.  4 fibroids were measured from 1.2 cm to 2.2 cm.   Princess Bruins  MD, 3:27 PM 10/31/2021

## 2021-11-04 ENCOUNTER — Ambulatory Visit: Payer: 59 | Admitting: Obstetrics & Gynecology

## 2021-11-28 ENCOUNTER — Ambulatory Visit (INDEPENDENT_AMBULATORY_CARE_PROVIDER_SITE_OTHER): Payer: 59 | Admitting: Obstetrics & Gynecology

## 2021-11-28 ENCOUNTER — Encounter: Payer: Self-pay | Admitting: Obstetrics & Gynecology

## 2021-11-28 VITALS — BP 138/86 | HR 95 | Resp 20

## 2021-11-28 DIAGNOSIS — D219 Benign neoplasm of connective and other soft tissue, unspecified: Secondary | ICD-10-CM | POA: Diagnosis not present

## 2021-11-28 DIAGNOSIS — Z30431 Encounter for routine checking of intrauterine contraceptive device: Secondary | ICD-10-CM

## 2021-11-28 DIAGNOSIS — N921 Excessive and frequent menstruation with irregular cycle: Secondary | ICD-10-CM | POA: Diagnosis not present

## 2021-11-28 NOTE — Progress Notes (Signed)
    Tammy Walton 11/23/1975 [id number]        46 y.o.  G5P3A2L3   RP: Mirena IUD check 4 weeks post insertion  HPI: Insertion of Mirena IUD on 10/31/21.  Well since insertion, but had a menstrual period soon after insertion on 11/14/21 which was still heavy.  No vaginal bleeding currently.  No pelvic pain.  No fever.     OB History  Gravida Para Term Preterm AB Living  '5 3 3 '$ 0 2 3  SAB IAB Ectopic Multiple Live Births  2 0 0 0 3    # Outcome Date GA Lbr Len/2nd Weight Sex Delivery Anes PTL Lv  5 Term 03/22/11 7w0d08:18 / 00:40 8 lb 0.8 oz (3.651 kg) M Vag-Spont EPI, Local  LIV     Birth Comments: caput, moulding  4 SAB 12/2009 624w0d       3 SAB 2011 5w55w0d         Birth Comments: chemical preg  2 Term 2008 39w39w0d00 7 lb 11 oz (3.487 kg) F Vag-Spont EPI  LIV  1 Term 2006 39w047w0d0 7 lb 6 oz (3.345 kg) M Vag-Spont EPI  LIV    Past medical history,surgical history, problem list, medications, allergies, family history and social history were all reviewed and documented in the EPIC chart.   Directed ROS with pertinent positives and negatives documented in the history of present illness/assessment and plan.  Exam:  Vitals:   11/28/21 1402  BP: 138/86  Pulse: 95  Resp: 20  SpO2: 97%   General appearance:  Normal  Abdomen: Normal  Gynecologic exam: Vulva normal.  Speculum:  Cervix normal, no erythema.  IUD strings visible at EO.  [facility] bleeding, no discharge.   Assessment/Plan:  46 y.21 [id number] Encounter for routine checking of intrauterine contraceptive device (IUD) Insertion of Mirena IUD on 10/31/21.  Well since insertion, but had a menstrual period soon after insertion on 11/14/21 which was still heavy.  No vaginal bleeding currently.  No pelvic pain.  No fever.  IUD in good position, no sign of infection.  2. Menorrhagia with irregular cycle Will observe for reduction of the menstrual flow under Mirena IUD.  If not improved at 3 months, patient will  schedule to manage differently.  3. Fibroids  [place] to assess the Fibroids and the Endometrial line. - [place]vic [place].  MariePrincess Bruins2:16 PM 11/28/2021

## 2022-03-09 ENCOUNTER — Other Ambulatory Visit: Payer: 59

## 2022-03-09 ENCOUNTER — Other Ambulatory Visit: Payer: 59 | Admitting: Obstetrics & Gynecology

## 2022-06-06 ENCOUNTER — Other Ambulatory Visit: Payer: Self-pay | Admitting: Obstetrics & Gynecology

## 2022-06-06 DIAGNOSIS — Z1231 Encounter for screening mammogram for malignant neoplasm of breast: Secondary | ICD-10-CM

## 2022-07-11 ENCOUNTER — Ambulatory Visit: Payer: 59

## 2022-08-08 ENCOUNTER — Ambulatory Visit
Admission: RE | Admit: 2022-08-08 | Discharge: 2022-08-08 | Disposition: A | Discharge status: Home / Self Care | Payer: 59 | Source: Ambulatory Visit | Attending: Obstetrics & Gynecology | Admitting: Obstetrics & Gynecology

## 2022-08-08 DIAGNOSIS — Z1231 Encounter for screening mammogram for malignant neoplasm of breast: Secondary | ICD-10-CM

## 2023-08-14 ENCOUNTER — Other Ambulatory Visit: Payer: Self-pay | Admitting: Obstetrics and Gynecology

## 2023-08-14 DIAGNOSIS — Z1231 Encounter for screening mammogram for malignant neoplasm of breast: Secondary | ICD-10-CM

## 2023-09-04 ENCOUNTER — Ambulatory Visit
Admission: RE | Admit: 2023-09-04 | Discharge: 2023-09-04 | Disposition: A | Discharge status: Home / Self Care | Source: Ambulatory Visit | Attending: Obstetrics and Gynecology | Admitting: Obstetrics and Gynecology

## 2023-09-04 DIAGNOSIS — Z1231 Encounter for screening mammogram for malignant neoplasm of breast: Secondary | ICD-10-CM

## 2023-09-08 ENCOUNTER — Ambulatory Visit: Payer: Self-pay | Admitting: Obstetrics and Gynecology
